# Patient Record
Sex: Female | Born: 1985 | Race: Black or African American | Hispanic: No | Marital: Single | Smoking: Former smoker
Health system: Southern US, Community
[De-identification: ages and names within clinical notes are randomized; demographics above are authoritative.]

## PROBLEM LIST (undated history)

## (undated) DIAGNOSIS — D649 Anemia, unspecified: Secondary | ICD-10-CM

## (undated) DIAGNOSIS — F329 Major depressive disorder, single episode, unspecified: Secondary | ICD-10-CM

## (undated) DIAGNOSIS — F99 Mental disorder, not otherwise specified: Secondary | ICD-10-CM

## (undated) DIAGNOSIS — F32A Depression, unspecified: Secondary | ICD-10-CM

## (undated) HISTORY — DX: Mental disorder, not otherwise specified: F99

## (undated) HISTORY — DX: Anemia, unspecified: D64.9

## (undated) HISTORY — PX: NO PAST SURGERIES: SHX2092

---
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
## 2008-08-31 ENCOUNTER — Encounter: Payer: Self-pay | Admitting: Maternal & Fetal Medicine

## 2008-12-12 ENCOUNTER — Observation Stay: Payer: Self-pay | Admitting: Obstetrics and Gynecology

## 2008-12-13 ENCOUNTER — Inpatient Hospital Stay: Payer: Self-pay | Admitting: Obstetrics and Gynecology

## 2010-12-20 ENCOUNTER — Ambulatory Visit: Payer: Self-pay | Admitting: Internal Medicine

## 2011-01-24 ENCOUNTER — Ambulatory Visit: Payer: Self-pay | Admitting: Family Medicine

## 2012-03-31 ENCOUNTER — Emergency Department: Payer: Self-pay | Admitting: Emergency Medicine

## 2012-04-23 ENCOUNTER — Ambulatory Visit: Payer: Self-pay | Admitting: Family Medicine

## 2012-09-22 ENCOUNTER — Emergency Department: Payer: Self-pay | Admitting: Emergency Medicine

## 2012-09-22 LAB — BASIC METABOLIC PANEL
Anion Gap: 8 (ref 7–16)
BUN: 11 mg/dL (ref 7–18)
Chloride: 108 mmol/L — ABNORMAL HIGH (ref 98–107)
Co2: 24 mmol/L (ref 21–32)
Glucose: 91 mg/dL (ref 65–99)
Osmolality: 278 (ref 275–301)

## 2012-09-22 LAB — CBC
HGB: 13.4 g/dL (ref 12.0–16.0)
MCHC: 32.9 g/dL (ref 32.0–36.0)
MCV: 83 fL (ref 80–100)
RBC: 4.89 10*6/uL (ref 3.80–5.20)
RDW: 13 % (ref 11.5–14.5)
WBC: 5.3 10*3/uL (ref 3.6–11.0)

## 2012-10-29 ENCOUNTER — Emergency Department: Payer: Self-pay | Admitting: Emergency Medicine

## 2012-10-29 LAB — URINALYSIS, COMPLETE
Bacteria: NONE SEEN
Bilirubin,UR: NEGATIVE
Blood: NEGATIVE
Glucose,UR: NEGATIVE mg/dL (ref 0–75)
Nitrite: NEGATIVE
Protein: NEGATIVE
RBC,UR: 1 /HPF (ref 0–5)
Specific Gravity: 1.026 (ref 1.003–1.030)
Squamous Epithelial: 10
WBC UR: 1 /HPF (ref 0–5)

## 2012-10-29 LAB — COMPREHENSIVE METABOLIC PANEL
Albumin: 3.8 g/dL (ref 3.4–5.0)
Alkaline Phosphatase: 78 U/L (ref 50–136)
Bilirubin,Total: 0.3 mg/dL (ref 0.2–1.0)
Calcium, Total: 9.2 mg/dL (ref 8.5–10.1)
EGFR (African American): >60
EGFR (Non-African Amer.): >60
Glucose: 88 mg/dL (ref 65–99)
Osmolality: 285 (ref 275–301)
SGOT(AST): 16 U/L (ref 15–37)
Sodium: 144 mmol/L (ref 136–145)

## 2012-10-29 LAB — CBC
HGB: 12.6 g/dL (ref 12.0–16.0)
MCH: 26.9 pg (ref 26.0–34.0)
MCHC: 32.2 g/dL (ref 32.0–36.0)
MCV: 84 fL (ref 80–100)
Platelet: 339 10*3/uL (ref 150–440)
RDW: 13.4 % (ref 11.5–14.5)

## 2012-10-29 LAB — LIPASE, BLOOD: Lipase: 105 U/L (ref 73–393)

## 2013-05-23 IMAGING — CT CT ABD-PELV W/ CM
1 of 2 series · 15 of 32 positions shown, 19 images · IV contrast (isovue)
Comparison: None

REASON FOR EXAM: (1) VOMITING ABD PAIN W/ SOLIDS; (2) VOMITING ABSD APIN
W/ SOLIDS
COMMENTS:

PROCEDURE:     CT  - CT ABDOMEN / PELVIS  W  - October 29, 2012  [DATE]
RESULT:     History: Vomiting, abdominal pain
TECHNIQUE: Multiple axial images of the abdomen and pelvis were performed
from the lung bases to the pubic symphysis, with p.o. contrast and with 100
ml of Isovue 370 intravenous contrast.

[Series 2: 3mm soft tissue · axial · 0.60mm/px · z∈[-1010,-598]mm · 15 of 149 slices shown, 19 images]
[im 6/149  soft-tissue]
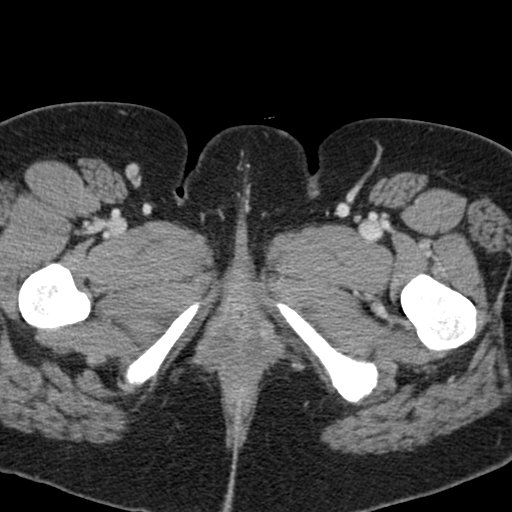
[im 6/149  bone]
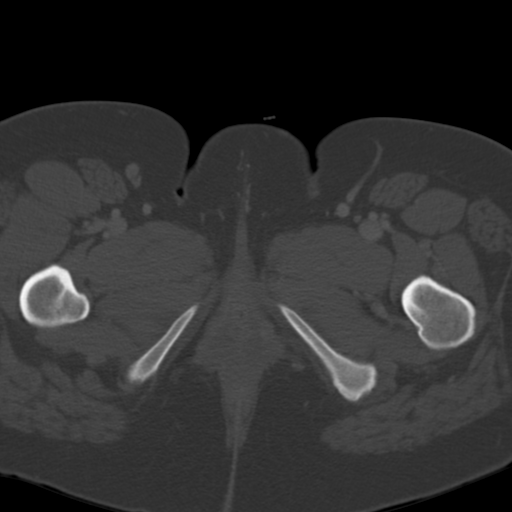
[im 18/149  soft-tissue]
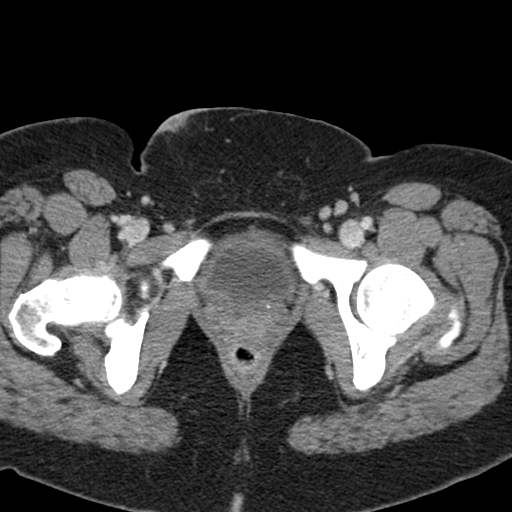
[im 30/149  soft-tissue]
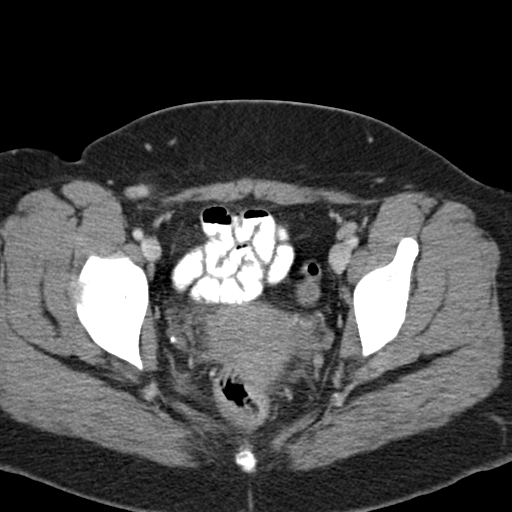
[im 42/149  soft-tissue]
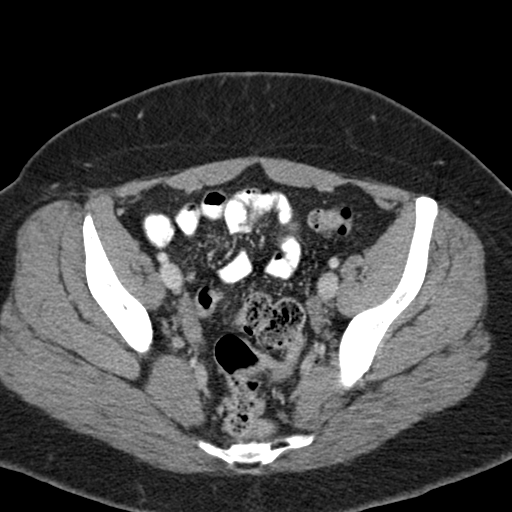
[im 54/149  soft-tissue]
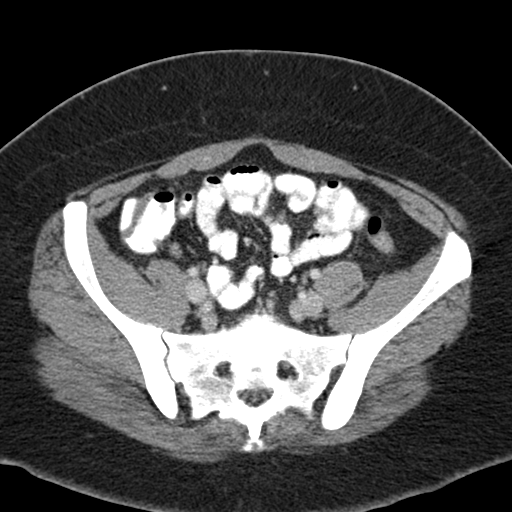
[im 66/149  soft-tissue]
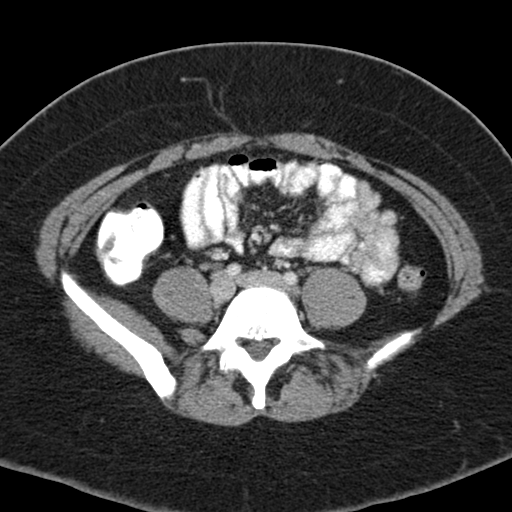
[im 77/149  soft-tissue]
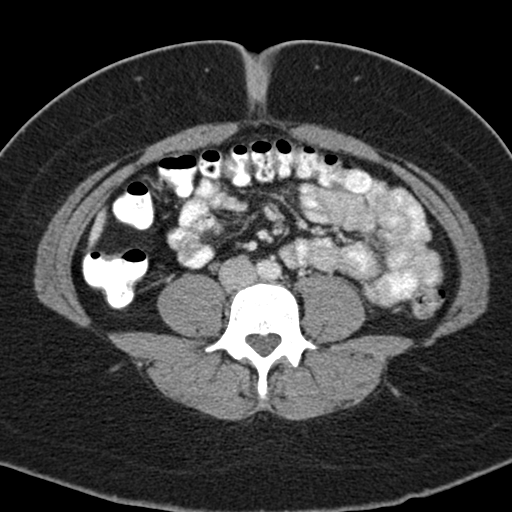
[im 83/149  soft-tissue]
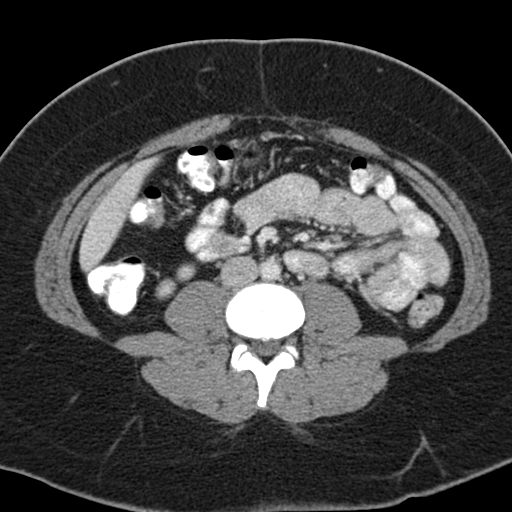
[im 95/149  soft-tissue]
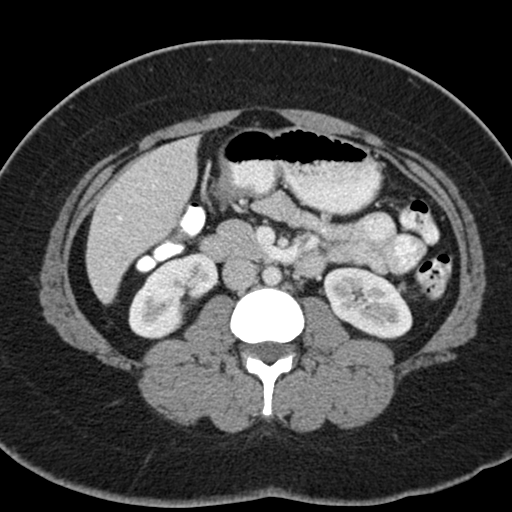
[im 95/149  bone]
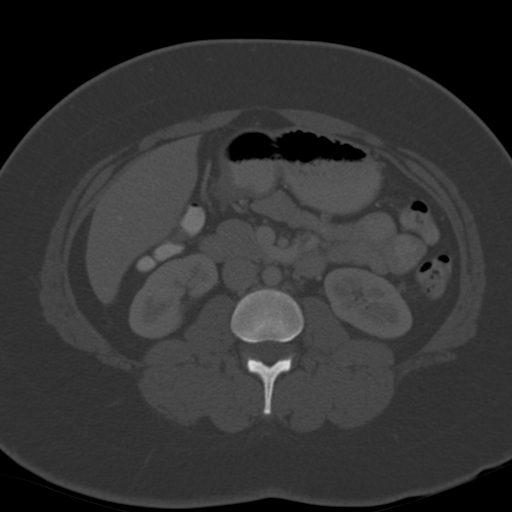
[im 107/149  soft-tissue]
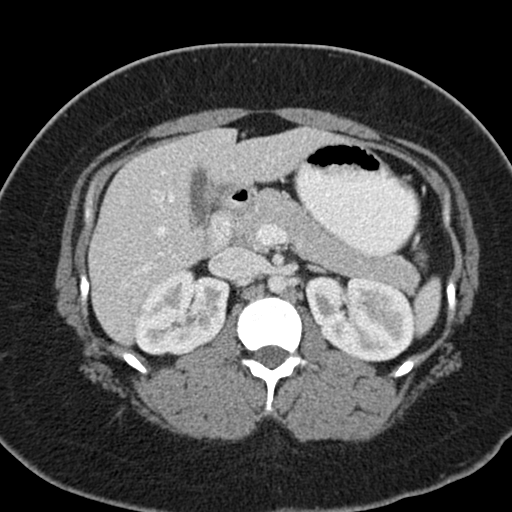
[im 119/149  soft-tissue]
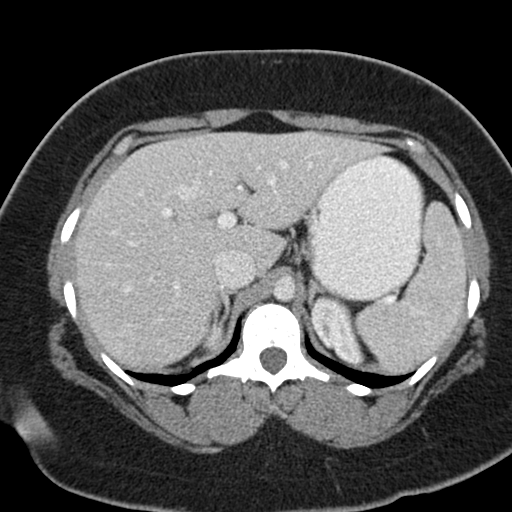
[im 125/149  lung]
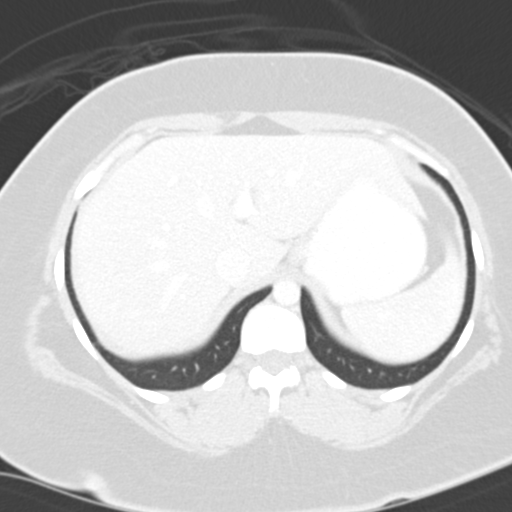
[im 131/149  soft-tissue]
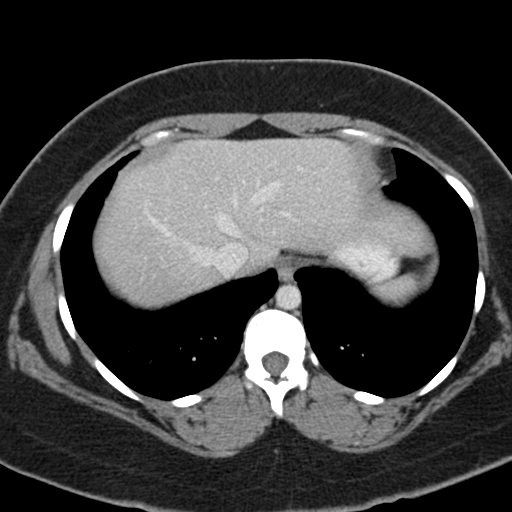
[im 131/149  lung]
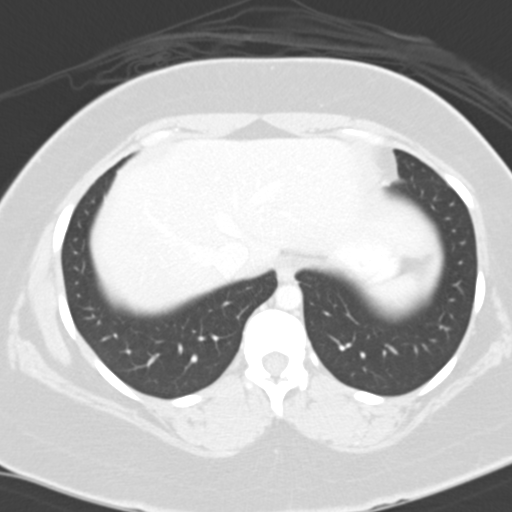
[im 137/149  lung]
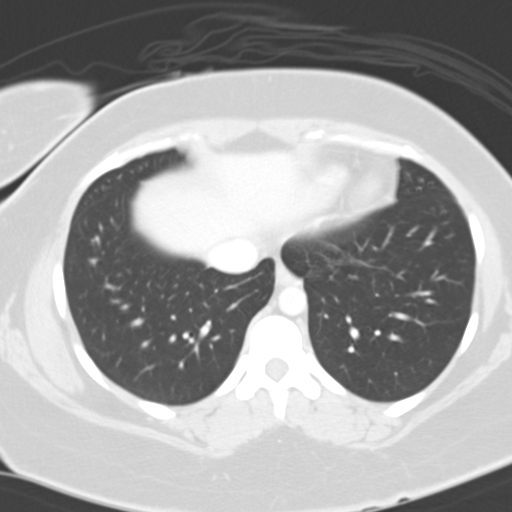
[im 143/149  soft-tissue]
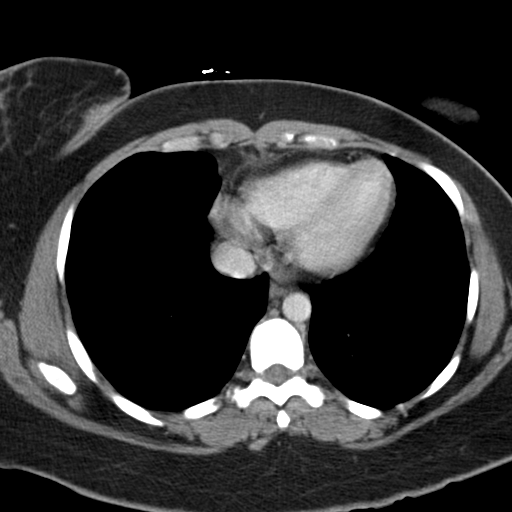
[im 143/149  lung]
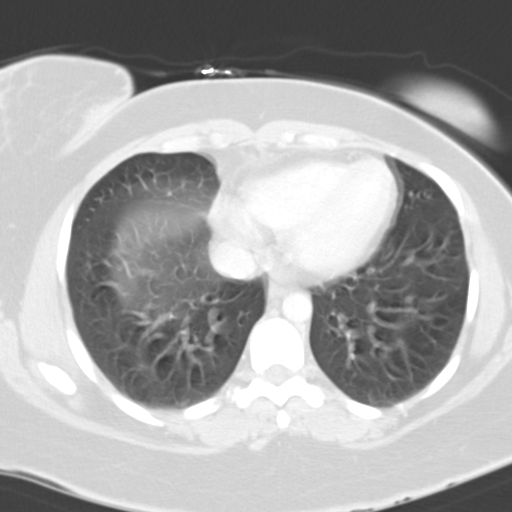

[15 of 32 positions shown; findings below may reference images not displayed]

FINDINGS: The lung bases are clear. There is no pneumothorax. The heart size is
normal.

The liver demonstrates no focal abnormality. There is no intrahepatic or
extrahepatic biliary ductal dilatation. The gallbladder is unremarkable. The
spleen demonstrates no focal abnormality. The kidneys, adrenal glands, and
pancreas are normal. The bladder is unremarkable.

The stomach, duodenum, small intestine, and large intestine demonstrate no
contrast extravasation or dilatation. There is a normal caliber appendix in
the right lower quadrant without periappendiceal inflammatory changes. There
is no pneumoperitoneum, pneumatosis, or portal venous gas. There is no
abdominal or pelvic free fluid. There is no lymphadenopathy.

The abdominal aorta is normal in caliber.

The osseous structures are unremarkable.
IMPRESSION: 1. No acute abdominal or pelvic pathology.

[REDACTED]

## 2013-08-22 ENCOUNTER — Emergency Department: Payer: Self-pay | Admitting: Emergency Medicine

## 2014-02-22 LAB — COMPREHENSIVE METABOLIC PANEL
ALK PHOS: 65 U/L
ANION GAP: 7 (ref 7–16)
AST: 18 U/L (ref 15–37)
Albumin: 3.8 g/dL (ref 3.4–5.0)
BILIRUBIN TOTAL: 0.5 mg/dL (ref 0.2–1.0)
BUN: 8 mg/dL (ref 7–18)
CALCIUM: 9.3 mg/dL (ref 8.5–10.1)
CHLORIDE: 108 mmol/L — AB (ref 98–107)
CREATININE: 0.68 mg/dL (ref 0.60–1.30)
Co2: 25 mmol/L (ref 21–32)
EGFR (African American): >60
EGFR (Non-African Amer.): >60
Glucose: 90 mg/dL (ref 65–99)
OSMOLALITY: 277 (ref 275–301)
POTASSIUM: 3.7 mmol/L (ref 3.5–5.1)
SGPT (ALT): 17 U/L (ref 12–78)
Sodium: 140 mmol/L (ref 136–145)
Total Protein: 8.2 g/dL (ref 6.4–8.2)

## 2014-02-22 LAB — URINALYSIS, COMPLETE
BILIRUBIN, UR: NEGATIVE
Blood: NEGATIVE
GLUCOSE, UR: NEGATIVE mg/dL (ref 0–75)
KETONE: NEGATIVE
Leukocyte Esterase: NEGATIVE
Nitrite: NEGATIVE
PH: 6 (ref 4.5–8.0)
Protein: NEGATIVE
RBC,UR: 3 /HPF (ref 0–5)
Specific Gravity: 1.024 (ref 1.003–1.030)
Squamous Epithelial: 31
WBC UR: NONE SEEN /HPF (ref 0–5)

## 2014-02-22 LAB — DRUG SCREEN, URINE
Amphetamines, Ur Screen: NEGATIVE (ref ?–1000)
Barbiturates, Ur Screen: NEGATIVE (ref ?–200)
Benzodiazepine, Ur Scrn: NEGATIVE (ref ?–200)
Cannabinoid 50 Ng, Ur ~~LOC~~: POSITIVE (ref ?–50)
Cocaine Metabolite,Ur ~~LOC~~: NEGATIVE (ref ?–300)
MDMA (Ecstasy)Ur Screen: NEGATIVE (ref ?–500)
Methadone, Ur Screen: NEGATIVE (ref ?–300)
Opiate, Ur Screen: NEGATIVE (ref ?–300)
PHENCYCLIDINE (PCP) UR S: NEGATIVE (ref ?–25)
TRICYCLIC, UR SCREEN: NEGATIVE (ref ?–1000)

## 2014-02-22 LAB — CBC
HCT: 37.9 % (ref 35.0–47.0)
HGB: 12.3 g/dL (ref 12.0–16.0)
MCH: 27.6 pg (ref 26.0–34.0)
MCHC: 32.4 g/dL (ref 32.0–36.0)
MCV: 85 fL (ref 80–100)
Platelet: 295 10*3/uL (ref 150–440)
RBC: 4.46 10*6/uL (ref 3.80–5.20)
RDW: 13.7 % (ref 11.5–14.5)
WBC: 8.7 10*3/uL (ref 3.6–11.0)

## 2014-02-22 LAB — ETHANOL: Ethanol: <3 mg/dL

## 2014-02-22 LAB — ACETAMINOPHEN LEVEL

## 2014-02-22 LAB — SALICYLATE LEVEL

## 2014-02-23 ENCOUNTER — Inpatient Hospital Stay: Payer: Self-pay | Admitting: Psychiatry

## 2014-09-11 NOTE — L&D Delivery Note (Signed)
Delivery Note  First Stage: Labor onset: 0700 Augmentation : None Analgesia /Anesthesia intrapartum: None SROM clear fluid at home @ 0600  Second Stage: Complete dilation at 1045 Onset of pushing at 1045 FHR second stage:   Delivery of a viable female at 62 by Carlean Jews, CNM in LOA position No nuchal cord Cord double clamped after cessation of pulsation, cut by CNM Cord blood sample collected   Third Stage: Placenta delivered via Tomasa Blase intact with 3 VC @ 1059 Placenta disposition: Hospital disposal Uterine tone Firm / bleeding - scant  No laceration identified / small abrasion on upper left labia minora - hemostatic and no sutures necessary Anesthesia for repair: non Est. Blood Loss (mL): 150  Complications: none  Mom to postpartum.  Baby to Couplet care / Skin to Skin.  Newborn: Birth Weight: Pending  Apgar Scores: 9, 9 Feeding planned: breast/bottle  [name], CNM

## 2014-11-05 ENCOUNTER — Ambulatory Visit: Payer: Self-pay | Admitting: Family Medicine
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
## 2015-01-04 ENCOUNTER — Other Ambulatory Visit: Payer: Self-pay | Admitting: Family Medicine

## 2015-01-04 DIAGNOSIS — Z0489 Encounter for examination and observation for other specified reasons: Secondary | ICD-10-CM

## 2015-01-04 DIAGNOSIS — IMO0002 Reserved for concepts with insufficient information to code with codable children: Secondary | ICD-10-CM

## 2015-01-18 ENCOUNTER — Ambulatory Visit
Admission: RE | Admit: 2015-01-18 | Discharge: 2015-01-18 | Disposition: A | Discharge status: Home / Self Care | Payer: Medicaid Other | Source: Ambulatory Visit | Attending: Family Medicine | Admitting: Family Medicine

## 2015-01-18 DIAGNOSIS — Z36 Encounter for antenatal screening of mother: Secondary | ICD-10-CM | POA: Diagnosis not present

## 2015-01-18 DIAGNOSIS — Z0489 Encounter for examination and observation for other specified reasons: Secondary | ICD-10-CM

## 2015-01-18 DIAGNOSIS — IMO0002 Reserved for concepts with insufficient information to code with codable children: Secondary | ICD-10-CM

## 2015-01-18 DIAGNOSIS — Z3A20 20 weeks gestation of pregnancy: Secondary | ICD-10-CM | POA: Insufficient documentation

## 2015-05-06 LAB — OB RESULTS CONSOLE RUBELLA ANTIBODY, IGM: RUBELLA: NON-IMMUNE/NOT IMMUNE

## 2015-05-06 LAB — OB RESULTS CONSOLE VARICELLA ZOSTER ANTIBODY, IGG: VARICELLA IGG: IMMUNE

## 2015-05-06 LAB — OB RESULTS CONSOLE GC/CHLAMYDIA
CHLAMYDIA, DNA PROBE: NEGATIVE
GC PROBE AMP, GENITAL: NEGATIVE

## 2015-05-06 LAB — OB RESULTS CONSOLE GBS: GBS: NEGATIVE

## 2015-06-04 ENCOUNTER — Encounter: Payer: Self-pay | Admitting: *Deleted

## 2015-06-04 ENCOUNTER — Inpatient Hospital Stay
Admission: EM | Admit: 2015-06-04 | Discharge: 2015-06-06 | DRG: 775 | Disposition: A | Discharge status: Home / Self Care | Payer: Medicaid Other | Attending: Obstetrics and Gynecology | Admitting: Obstetrics and Gynecology

## 2015-06-04 DIAGNOSIS — D62 Acute posthemorrhagic anemia: Secondary | ICD-10-CM | POA: Diagnosis not present

## 2015-06-04 DIAGNOSIS — F17210 Nicotine dependence, cigarettes, uncomplicated: Secondary | ICD-10-CM | POA: Diagnosis present

## 2015-06-04 DIAGNOSIS — O99344 Other mental disorders complicating childbirth: Secondary | ICD-10-CM | POA: Diagnosis present

## 2015-06-04 DIAGNOSIS — F329 Major depressive disorder, single episode, unspecified: Secondary | ICD-10-CM | POA: Diagnosis present

## 2015-06-04 DIAGNOSIS — O9081 Anemia of the puerperium: Secondary | ICD-10-CM | POA: Diagnosis not present

## 2015-06-04 DIAGNOSIS — Z3A39 39 weeks gestation of pregnancy: Secondary | ICD-10-CM | POA: Diagnosis present

## 2015-06-04 DIAGNOSIS — Z79899 Other long term (current) drug therapy: Secondary | ICD-10-CM | POA: Diagnosis not present

## 2015-06-04 DIAGNOSIS — O99334 Smoking (tobacco) complicating childbirth: Secondary | ICD-10-CM | POA: Diagnosis present

## 2015-06-04 DIAGNOSIS — Z3483 Encounter for supervision of other normal pregnancy, third trimester: Secondary | ICD-10-CM | POA: Diagnosis present

## 2015-06-04 LAB — CBC
HCT: 34.6 % — ABNORMAL LOW (ref 35.0–47.0)
Hemoglobin: 11.4 g/dL — ABNORMAL LOW (ref 12.0–16.0)
MCH: 27.9 pg (ref 26.0–34.0)
MCHC: 33.1 g/dL (ref 32.0–36.0)
MCV: 84.4 fL (ref 80.0–100.0)
PLATELETS: 254 10*3/uL (ref 150–440)
RBC: 4.1 MIL/uL (ref 3.80–5.20)
RDW: 14.1 % (ref 11.5–14.5)
WBC: 10.9 10*3/uL (ref 3.6–11.0)

## 2015-06-04 LAB — TYPE AND SCREEN
ABO/RH(D): O POS
ANTIBODY SCREEN: NEGATIVE

## 2015-06-04 LAB — ABO/RH: ABO/RH(D): O POS

## 2015-06-04 MED ORDER — CITRIC ACID-SODIUM CITRATE 334-500 MG/5ML PO SOLN: 30.0000 mL | ORAL | Status: DC | PRN

## 2015-06-04 MED ORDER — SIMETHICONE 80 MG PO CHEW: 80.0000 mg | CHEWABLE_TABLET | ORAL | Status: DC | PRN

## 2015-06-04 MED ORDER — OXYTOCIN 40 UNITS IN LACTATED RINGERS INFUSION - SIMPLE MED
INTRAVENOUS | Status: AC
  Administered 2015-06-04: 999 mL/h via INTRAVENOUS
  Filled 2015-06-04: qty 1000

## 2015-06-04 MED ORDER — IBUPROFEN 600 MG PO TABS
600.0000 mg | ORAL_TABLET | Freq: Four times a day (QID) | ORAL | Status: DC
  Administered 2015-06-04 – 2015-06-06 (×8): 600 mg via ORAL
  Filled 2015-06-04 (×8): qty 1

## 2015-06-04 MED ORDER — ACETAMINOPHEN 325 MG PO TABS: 650.0000 mg | ORAL_TABLET | ORAL | Status: DC | PRN

## 2015-06-04 MED ORDER — AMMONIA AROMATIC IN INHA
RESPIRATORY_TRACT | Status: AC
  Filled 2015-06-04: qty 10

## 2015-06-04 MED ORDER — OXYTOCIN BOLUS FROM INFUSION: 500.0000 mL | INTRAVENOUS | Status: DC

## 2015-06-04 MED ORDER — OXYCODONE-ACETAMINOPHEN 5-325 MG PO TABS: 2.0000 | ORAL_TABLET | ORAL | Status: DC | PRN

## 2015-06-04 MED ORDER — MEASLES, MUMPS & RUBELLA VAC ~~LOC~~ INJ
0.5000 mL | INJECTION | Freq: Once | SUBCUTANEOUS | Status: AC
  Administered 2015-06-06: 0.5 mL via SUBCUTANEOUS
  Filled 2015-06-04: qty 0.5

## 2015-06-04 MED ORDER — LANOLIN HYDROUS EX OINT: TOPICAL_OINTMENT | CUTANEOUS | Status: DC | PRN

## 2015-06-04 MED ORDER — LIDOCAINE HCL (PF) 1 % IJ SOLN
INTRAMUSCULAR | Status: AC
  Filled 2015-06-04: qty 30

## 2015-06-04 MED ORDER — ZOLPIDEM TARTRATE 5 MG PO TABS: 5.0000 mg | ORAL_TABLET | Freq: Every evening | ORAL | Status: DC | PRN

## 2015-06-04 MED ORDER — ONDANSETRON HCL 4 MG/2ML IJ SOLN: 4.0000 mg | Freq: Four times a day (QID) | INTRAMUSCULAR | Status: DC | PRN

## 2015-06-04 MED ORDER — OXYTOCIN 40 UNITS IN LACTATED RINGERS INFUSION - SIMPLE MED
62.5000 mL/h | INTRAVENOUS | Status: DC
  Administered 2015-06-04: 999 mL/h via INTRAVENOUS

## 2015-06-04 MED ORDER — LACTATED RINGERS IV SOLN: INTRAVENOUS | Status: DC

## 2015-06-04 MED ORDER — ONDANSETRON HCL 4 MG PO TABS: 4.0000 mg | ORAL_TABLET | ORAL | Status: DC | PRN

## 2015-06-04 MED ORDER — DIBUCAINE 1 % RE OINT: 1.0000 "application " | TOPICAL_OINTMENT | RECTAL | Status: DC | PRN

## 2015-06-04 MED ORDER — OXYCODONE-ACETAMINOPHEN 5-325 MG PO TABS: 1.0000 | ORAL_TABLET | ORAL | Status: DC | PRN

## 2015-06-04 MED ORDER — BENZOCAINE-MENTHOL 20-0.5 % EX AERO: 1.0000 "application " | INHALATION_SPRAY | CUTANEOUS | Status: DC | PRN

## 2015-06-04 MED ORDER — DIPHENHYDRAMINE HCL 25 MG PO CAPS: 25.0000 mg | ORAL_CAPSULE | Freq: Four times a day (QID) | ORAL | Status: DC | PRN

## 2015-06-04 MED ORDER — LIDOCAINE HCL (PF) 1 % IJ SOLN
30.0000 mL | INTRAMUSCULAR | Status: DC | PRN
  Filled 2015-06-04: qty 30

## 2015-06-04 MED ORDER — ONDANSETRON HCL 4 MG/2ML IJ SOLN: 4.0000 mg | INTRAMUSCULAR | Status: DC | PRN

## 2015-06-04 MED ORDER — OXYTOCIN 10 UNIT/ML IJ SOLN
INTRAMUSCULAR | Status: AC
  Filled 2015-06-04: qty 2

## 2015-06-04 MED ORDER — WITCH HAZEL-GLYCERIN EX PADS: 1.0000 "application " | MEDICATED_PAD | CUTANEOUS | Status: DC | PRN

## 2015-06-04 MED ORDER — LACTATED RINGERS IV SOLN: 500.0000 mL | INTRAVENOUS | Status: DC | PRN

## 2015-06-04 MED ORDER — OXYCODONE-ACETAMINOPHEN 5-325 MG PO TABS
2.0000 | ORAL_TABLET | ORAL | Status: DC | PRN
Start: 2015-06-04 — End: 2015-06-04

## 2015-06-04 MED ORDER — PRENATAL MULTIVITAMIN CH
1.0000 | ORAL_TABLET | Freq: Every day | ORAL | Status: DC
  Administered 2015-06-04 – 2015-06-06 (×3): 1 via ORAL
  Filled 2015-06-04 (×3): qty 1

## 2015-06-04 MED ORDER — MISOPROSTOL 200 MCG PO TABS
ORAL_TABLET | ORAL | Status: AC
  Filled 2015-06-04: qty 4

## 2015-06-04 MED ORDER — SENNOSIDES-DOCUSATE SODIUM 8.6-50 MG PO TABS: 2.0000 | ORAL_TABLET | ORAL | Status: DC

## 2015-06-04 NOTE — Lactation Note (Signed)
This note was copied from the chart of Kristen Good. Lactation Consultation Note  Patient Name: Kristen Good [id number] Date: 06/04/2015 Reason for consult: Follow-up assessment;Other (Comment) (bleeding from right nipple when pt squeezes it)   Maternal Data  Pt noted bleeding from right nipple when she squeezed, I noted the blood but also colostrum, encouraged to not express so hard, does not cause her pain, pt wants to formula feed but is breastfeeding because her dr. Javier Docker her to try, she states she will try one more time, informed that the blood on nipple will not hurt baby.  Pt shown how to shaped breast to get a deeper latch, baby eager to nurse.  Feeding Feeding Type: Breast Milk Length of feed: 25 min (right breast)  LATCH Score/Interventions Latch: Grasps breast easily, tongue down, lips flanged, rhythmical sucking.  Audible Swallowing: A few with stimulation Intervention(s): Skin to skin  Type of Nipple: Everted at rest and after stimulation  Comfort (Breast/Nipple): Engorged, cracked, bleeding, large blisters, severe discomfort Problem noted: Cracked, bleeding, blisters, bruises (bleeding on right but no pain) Intervention(s): Expressed breast milk to nipple     Hold (Positioning): Assistance needed to correctly position infant at breast and maintain latch. Intervention(s): Support Pillows;Position options  LATCH Score: 6  Lactation Tools Discussed/Used [facility] Program: Yes   Consult Status      Kristen Good 06/04/2015, 5:18 PM

## 2015-06-04 NOTE — H&P (Signed)
  OB ADMISSION/ HISTORY & PHYSICAL:  Admission Date: 06/04/2015  9:35 AM  Admit Diagnosis: Active labor at Term  Kristen Good is a 29 y.o. female G2P1 presenting for active labor at 39+6 weeks.    Prenatal History: G2P1001   EDC : 06/05/2015, by [place] on 11/05/14 Prenatal care at [facility] Provider: Dr. Maryruth Hancock Prenatal course complicated by Tobacco abuse, history of a PPH with last delivery in 2010 at 40+ weeks requiring 2 units PRBC transfusion, hx of depression on zoloft and prozac  Prenatal Labs: ABO, Rh:  O positive (11/10/14) Antibody:  Negative (11/10/14) Rubella:   Non-Immune (11/10/14) RPR:   Non-reactive (11/10/14) HBsAg:   Negative (11/10/14) HIV:   Non-Reactive (11/10/14) GTT: 131 (03/04/15) GBS:   Negative (05/08/15)  Medical / Surgical History :  Past medical history: Prediabetes, bipolar - not on medications / Rubella Non-immune  Past surgical history: History reviewed. No pertinent past surgical history.  Family History: History reviewed. No pertinent family history.   Social History:  reports that she has been smoking Cigarettes.  She has been smoking about 0.25 packs per day. She does not have any smokeless tobacco history on file. She reports that she does not drink alcohol or use illicit drugs.   Allergies: Review of patient's allergies indicates no known allergies.    Current Medications at time of admission:  Prior to Admission medications   Medication Sig Start Date End Date Taking? Authorizing Provider  ferrous fumarate (HEMOCYTE - 106 MG FE) 325 (106 FE) MG TABS tablet Take 1 tablet by mouth.   Yes Historical Provider, MD  Prenatal Vit-Fe Fumarate-FA (PRENATAL MULTIVITAMIN) TABS tablet Take 1 tablet by mouth daily at 12 noon.   Yes Historical Provider, MD     Review of Systems: Active FM onset of ctx @ 0700 currently every 1-2 minutes LOF  / SROM @ 0600 at home - clear fluid bloody show none   Physical Exam:  VS: Blood pressure 130/60,  pulse 82, unknown if currently breastfeeding.  General: alert and oriented, appears anxious about delivery with no medications Heart: RRR Lungs: Clear lung fields Abdomen: Gravid, soft and non-tender, non-distended / obese / uterus: non-tender Extremities: non-pitting LE edema  Genitalia / VE: Dilation: Lip/rim Effacement (%): 100 Station: +1 Exam by:: JAC  FHR: baseline rate 140bmp / variability Moderate / accelerations 1 10x10 / no decelerations TOCO: ctx every 1-2 minutes   Assessment: 39+[redacted] weeks gestation Active stage of labor FHR category 2   Plan:  Anticipate NSVD  Dr. Dalbert Garnet notified of admission / plan of care  [name], CNM

## 2015-06-05 LAB — CBC
HEMATOCRIT: 29.9 % — AB (ref 35.0–47.0)
Hemoglobin: 10 g/dL — ABNORMAL LOW (ref 12.0–16.0)
MCH: 28.2 pg (ref 26.0–34.0)
MCHC: 33.5 g/dL (ref 32.0–36.0)
MCV: 84.1 fL (ref 80.0–100.0)
PLATELETS: 193 10*3/uL (ref 150–440)
RBC: 3.56 MIL/uL — ABNORMAL LOW (ref 3.80–5.20)
RDW: 14 % (ref 11.5–14.5)
WBC: 10.3 10*3/uL (ref 3.6–11.0)

## 2015-06-05 LAB — RPR: RPR Ser Ql: NONREACTIVE

## 2015-06-05 MED ORDER — POLYSACCHARIDE IRON COMPLEX 150 MG PO CAPS
150.0000 mg | ORAL_CAPSULE | Freq: Every day | ORAL | Status: DC
  Administered 2015-06-05 – 2015-06-06 (×2): 150 mg via ORAL
  Filled 2015-06-05 (×2): qty 1

## 2015-06-05 NOTE — Progress Notes (Addendum)
PPD # 1, SVD, baby girl  S:  Reports feeling good with minimal pain  Did not get much rest overnight             Tolerating po/ No nausea or vomiting             Bleeding is light             Pain controlled with Motrin             Up ad lib / ambulatory / voiding QS - no dysuria / no discomfort with abrasion    Newborn breast and formula feeding   O:               VS: BP 93/50 mmHg  Pulse 78  Temp(Src) 98 F (36.7 C) (Oral)  Resp 18  Ht  (1.6 m)  Wt 108.863 kg (240 lb)  BMI 42.52 kg/m2  SpO2 100%  Breastfeeding? Unknown   LABS:              Recent Labs  06/04/15 1109 06/05/15 0449  WBC 10.9 10.3  HGB 11.4* 10.0*  PLT 254 193               Blood type: --/--/O POS (09/23 1110)  Rubella: Nonimmune (08/25 0000)                     I&O: Intake/Output      09/23 0701 - 09/24 0700 09/24 0701 - 09/25 0700   I.V. (mL/kg) 1940.2 (17.8)    Total Intake(mL/kg) 1940.2 (17.8)    Urine (mL/kg/hr) 425    Total Output 425     Net +1515.2          Urine Occurrence 1 x                  Physical Exam:             Alert and oriented X3  Lungs: Clear and unlabored  Heart: regular rate and rhythm / no mumurs  Abdomen: soft, non-tender, non-distended              Fundus: firm, non-tender, U -1   Perineum: intact, no significant edema, no erythema, no ecchymosis  Lochia: light, bright red, no clots  Extremities: no edema, no calf pain or tenderness    A: PPD # 1, SVD, baby girl   Doing well - stable status  Mild ABL Anemia   P: Routine post partum orders  Begin Niferex daily   Encouraged to rest this morning, but may ambulate in halls this afternoon  Anticipate discharge tomorrow morning  [name], CNM

## 2015-06-06 MED ORDER — MEDROXYPROGESTERONE ACETATE 150 MG/ML IM SUSP
150.0000 mg | Freq: Once | INTRAMUSCULAR | Status: AC
  Administered 2015-06-06: 150 mg via INTRAMUSCULAR
  Filled 2015-06-06: qty 1

## 2015-06-06 MED ORDER — IBUPROFEN 600 MG PO TABS: 600.0000 mg | ORAL_TABLET | Freq: Four times a day (QID) | ORAL | Status: DC

## 2015-06-06 NOTE — Discharge Instructions (Signed)
Postpartum Care After Vaginal Delivery °After you deliver your newborn (postpartum period), the usual stay in the hospital is 24-72 hours. If there were problems with your labor or delivery, or if you have other medical problems, you might be in the hospital longer.  °While you are in the hospital, you will receive help and instructions on how to care for yourself and your newborn during the postpartum period.  °While you are in the hospital: °· Be sure to tell your nurses if you have pain or discomfort, as well as where you feel the pain and what makes the pain worse. °· If you had an incision made near your vagina (episiotomy) or if you had some tearing during delivery, the nurses may put ice packs on your episiotomy or tear. The ice packs may help to reduce the pain and swelling. °· If you are breastfeeding, you may feel uncomfortable contractions of your uterus for a couple of weeks. This is normal. The contractions help your uterus get back to normal size. °· It is normal to have some bleeding after delivery. °· For the first 1-3 days after delivery, the flow is red and the amount may be similar to a period. °· It is common for the flow to start and stop. °· In the first few days, you may pass some small clots. Let your nurses know if you begin to pass large clots or your flow increases. °· Do not  flush blood clots down the toilet before having the nurse look at them. °· During the next 3-10 days after delivery, your flow should become more watery and pink or brown-tinged in color. °· Ten to fourteen days after delivery, your flow should be a small amount of yellowish-white discharge. °· The amount of your flow will decrease over the first few weeks after delivery. Your flow may stop in 6-8 weeks. Most women have had their flow stop by 12 weeks after delivery. °· You should change your sanitary pads frequently. °· Wash your hands thoroughly with soap and water for at least 20 seconds after changing pads, using  the toilet, or before holding or feeding your newborn. °· You should feel like you need to empty your bladder within the first 6-8 hours after delivery. °· In case you become weak, lightheaded, or faint, call your nurse before you get out of bed for the first time and before you take a shower for the first time. °· Within the first few days after delivery, your breasts may begin to feel tender and full. This is called engorgement. Breast tenderness usually goes away within 48-72 hours after engorgement occurs. You may also notice milk leaking from your breasts. If you are not breastfeeding, do not stimulate your breasts. Breast stimulation can make your breasts produce more milk. °· Spending as much time as possible with your newborn is very important. During this time, you and your newborn can feel close and get to know each other. Having your newborn stay in your room (rooming in) will help to strengthen the bond with your newborn.  It will give you time to get to know your newborn and become comfortable caring for your newborn. °· Your hormones change after delivery. Sometimes the hormone changes can temporarily cause you to feel sad or tearful. These feelings should not last more than a few days. If these feelings last longer than that, you should talk to your caregiver. °· If desired, talk to your caregiver about methods of family planning or contraception. °·   Talk to your caregiver about immunizations. Your caregiver may want you to have the following immunizations before leaving the hospital:  Tetanus, diphtheria, and pertussis (Tdap) or tetanus and diphtheria (Td) immunization. It is very important that you and your family (including grandparents) or others caring for your newborn are up-to-date with the Tdap or Td immunizations. The Tdap or Td immunization can help protect your newborn from getting ill.  Rubella immunization.  Varicella (chickenpox) immunization.  Influenza immunization. You should  receive this annual immunization if you did not receive the immunization during your pregnancy. Document Released: 06/25/2007 Document Revised: 05/22/2012 Document Reviewed: 04/24/2012 [facility] Patient Information 2015 [place], [place]. This information is not intended to replace advice given to you by your health care provider. Make sure you discuss any questions you have with your health care provider.  Breast Pumping Tips If you are breastfeeding, there may be times when you cannot feed your baby directly. Returning to work or going on a trip are common examples. Pumping allows you to store breast milk and feed it to your baby later.  You may not get much milk when you first start to pump. Your breasts should start to make more after a few days. If you pump at the times you usually feed your baby, you may be able to keep making enough milk to feed your baby without also using formula. The more often you pump, the more milk you will produce. WHEN SHOULD I PUMP?   You can begin to pump soon after delivery. However, some experts recommend waiting about 4 weeks before giving your infant a bottle to make sure breastfeeding is going well.  If you plan to return to work, begin pumping a few weeks before. This will help you develop techniques that work best for you. It also lets you build up a supply of breast milk.   When you are with your infant, feed on demand and pump after each feeding.   When you are away from your infant for several hours, pump for about 15 minutes every 2-3 hours. Pump both breasts at the same time if you can.   If your infant has a formula feeding, make sure to pump around the same time.   If you drink any alcohol, wait 2 hours before pumping.  HOW DO I PREPARE TO PUMP? Your let-down reflexis the natural reaction to stimulation that makes your breast milk flow. It is easier to stimulate this reflex when you are relaxed. Find relaxation techniques that work for you.  If you have difficulty with your let-down reflex, try these methods:   Smell one of your infant's blankets or an item of clothing.   Look at a picture or video of your infant.   Sit in a quiet, private space.   Massage the breast you plan to pump.   Place soothing warmth on the breast.   Play relaxing music.  WHAT ARE SOME GENERAL BREAST PUMPING TIPS?  Wash your hands before you pump. You do not need to wash your nipples or breasts.  There are three ways to pump.  You can use your hand to massage and compress your breast.  You can use a handheld manual pump.  You can use an electric pump.   Make sure the suction cup (flange) on the breast pump is the right size. Place the flange directly over the nipple. If it is the wrong size or placed the wrong way, it may be painful and cause nipple damage.  If pumping is uncomfortable, apply a small amount of purified or modified lanolin to your nipple and areola.  If you are using an electric pump, adjust the speed and suction power to be more comfortable.  If pumping is painful or if you are not getting very much milk, you may need a different type of pump. A lactation consultant can help you determine what type of pump to use.   Keep a full water bottle near you at all times. Drinking lots of fluid helps you make more milk.  You can store your milk to use later. Pumped breast milk can be stored in a sealable, sterile container or plastic bag. Label all stored breast milk with the date you pumped it.  Milk can stay out at room temperature for up to 8 hours.  You can store your milk in the refrigerator for up to 8 days.  You can store your milk in the freezer for 3 months. Thaw frozen milk using warm water. Do not put it in the microwave.  Do not smoke. Smoking can lower your milk supply and harm your infant. If you need help quitting, ask your health care provider to recommend a program.  WHEN SHOULD I CALL MY HEALTH  CARE PROVIDER OR A LACTATION CONSULTANT?  You are having trouble pumping.  You are concerned that you are not making enough milk.  You have nipple pain, soreness, or redness.  You want to use birth control. Birth control pills may lower your milk supply. Talk to your health care provider about your options. Document Released: 02/15/2010 Document Revised: 09/02/2013 Document Reviewed: 06/20/2013 [facility] Patient Information 2015 [place], [place]. This information is not intended to replace advice given to you by your health care provider. Make sure you discuss any questions you have with your health care provider. Acetaminophen tablets or caplets What is this medicine? ACETAMINOPHEN (a set a MEE noe fen) is a pain reliever. It is used to treat mild pain and fever. This medicine may be used for other purposes; ask your health care provider or pharmacist if you have questions. COMMON BRAND NAME(S): Aceta, Actamin, Anacin Aspirin Free, Genapap, Genebs, Mapap, Pain & Fever, Pain and Fever, PAIN RELIEF, PAIN RELIEF Extra Strength, Pain Reliever, Panadol, PHARBETOL, Q-Pap, Q-Pap Extra Strength, Tylenol, Tylenol CrushableTablet, Tylenol Extra Strength, XS No Aspirin, XS Pain Reliever What should I tell my health care provider before I take this medicine? They need to know if you have any of these conditions: -if you often drink alcohol -liver disease -an unusual or allergic reaction to acetaminophen, other medicines, foods, dyes, or preservatives -pregnant or trying to get pregnant -breast-feeding How should I use this medicine? Take this medicine by mouth with a glass of water. Follow the directions on the package or prescription label. Take your medicine at regular intervals. Do not take your medicine more often than directed. Talk to your pediatrician regarding the use of this medicine in children. While this drug may be prescribed for children as young as 72 years of age for selected conditions,  precautions do apply. Overdosage: If you think you have taken too much of this medicine contact a poison control center or emergency room at once. NOTE: This medicine is only for you. Do not share this medicine with others. What if I miss a dose? If you miss a dose, take it as soon as you can. If it is almost time for your next dose, take only that dose. Do not take double or  extra doses. What may interact with this medicine? -alcohol -imatinib -isoniazid -other medicines with acetaminophen This list may not describe all possible interactions. Give your health care provider a list of all the medicines, herbs, non-prescription drugs, or dietary supplements you use. Also tell them if you smoke, drink alcohol, or use illegal drugs. Some items may interact with your medicine. What should I watch for while using this medicine? Tell your doctor or health care professional if the pain lasts more than 10 days (5 days for children), if it gets worse, or if there is a new or different kind of pain. Also, check with your doctor if a fever lasts for more than 3 days. Do not take other medicines that contain acetaminophen with this medicine. Always read labels carefully. If you have questions, ask your doctor or pharmacist. If you take too much acetaminophen get medical help right away. Too much acetaminophen can be very dangerous and cause liver damage. Even if you do not have symptoms, it is important to get help right away. What side effects may I notice from receiving this medicine? Side effects that you should report to your doctor or health care professional as soon as possible: -allergic reactions like skin rash, itching or hives, swelling of the face, lips, or tongue -breathing problems -fever or sore throat -redness, blistering, peeling or loosening of the skin, including inside the mouth -trouble passing urine or change in the amount of urine -unusual bleeding or bruising -unusually weak or  tired -yellowing of the eyes or skin Side effects that usually do not require medical attention (report to your doctor or health care professional if they continue or are bothersome): -headache -nausea, stomach upset This list may not describe all possible side effects. Call your doctor for medical advice about side effects. You may report side effects to FDA at 1-800-FDA-1088. Where should I keep my medicine? Keep out of reach of children. Store at room temperature between 20 and 25 degrees C (68 and 77 degrees F). Protect from moisture and heat. Throw away any unused medicine after the expiration date. NOTE: This sheet is a summary. It may not cover all possible information. If you have questions about this medicine, talk to your doctor, pharmacist, or health care provider.  2015, Elsevier/Gold Standard. (2013-04-21 12:54:16) Prenatal Vitamin and Mineral Combinations (oral solid dosage forms) What is this medicine? PRENATAL VITAMIN AND MINERAL combinations are used before, during, and after pregnancy to help provide provide good nutrition. This medicine may be used for other purposes; ask your health care provider or pharmacist if you have questions. COMMON BRAND NAME(S): Active OB, Advanced Care Plus, Advanced NatalCare, Advanced-RF NatalCare, Aminate Fe, Anemagen OB, B-Nexa, BP FoliNatal Plus B, BP MultiNatal Plus, BP MultiNatal Plus Chewable, BP Prenate, Brainstrong, Bright Beginnings Prenatal, Cal-Nate, Calcium PNV, CareNatal DHA, CareNate 600, Cavan One Omega, Cavan Prenatal with EC Calcium, Cavan-Alpha, Cavan-EC SOD DHA, Cavan-Heme OB, Cavan-Heme Omega, Cenogen Ultra, Centrum Specialist Prenatal, CertaVite with Antioxidants, Choice-OB + DHA, Citracal Prenatal, Citracal Prenatal + DHA, CitraNatal 90 DHA, CitraNatal Assure, CitraNatal B-Calm, CitraNatal DHA, CitraNatal Harmony, CitraNatal Rx, Classic Prenatal, ComBi Rx, Complete Natal DHA, Complete-RF, CompleteNate, Concept DHA, Concept OB,  CoreNate-DHA, Corvite FE with Quatrefolic, CRNatal DHA, Daily Vitamin, Docosavit, Duet, Duet Chewable, Duet DHA, Duet DHA 400, Duet DHA 430ec, Duet DHA Balanced, Duet DHA Complete, Duet DHA Complete Gluten Free, Duet DHA EC, Duet DHA Ferrazone, EC Omega-3, DuoVit DHA, Edge OB, Elite OB, Elite OB with DHA, Elite-OB 400, Extra-Virt Plus, Femecal  OB, Femecal OB Plus DHA, Ferrocite Plus, Folbecal, Folcal DHA, [address] One, [address] 3, [place] One with [place], [place] OB, Folivane-EC Calcium DHA NF, Foltabs 90 Plus DHA, Foltabs Prenatal, Foltabs Prenatal Plus DHA, Gesticare, Gesticare DHA, Gesticare DHA Delayed-Release, HemeNatal OB, HemeNatal OB + DHA, Hemocyte Plus, HIP Prenatal, ICAR Prenatal Rx, iNatal Advance, iNatal GT, iNatal Ultra, Infanate Balance, Infanate DHA, Infanate Plus, Kolnatal, Lactocal-F, Levomefolate PNV, MACNATAL CN DHA, Marnatal-F, Marnatal-F Plus, Materna, Maxinate, Mission Prenatal, Mission Prenatal F.A., Mission Prenatal H.P., Mom's Choice Rx, Multi-Nate 30, Multi-Nate 30 DHA, Multi-Nate DHA Extra, Multifol Plus, Multivitamin With Minerals, MyNatal OB Prenatal, NataCaps, NataChew, NataFolic-OB, Natafort, Natal-V RX, NatalCare CFe 60, NatalCare GlossTabs, NatalCare PIC, [employer], [employer], [employer], [address] Three, NATALVIRT 90 DHA, NATALVIRT CA, NataTab CFe, NataTab FA, Natatab Rx, Natelle, Natelle C, Natelle One, [employer] with DHA, Natelle Prefer, Natelle-ez, Navatab + DHA, Neevo, Neevo DHA, Nestabs, Nestabs ABC, Nestabs CBF, Nestabs DHA, Nestabs FA, Nestabs Rx, New Advanced Formula Prenatal Z, Nexa Plus, Nexa Select, Niferex-PN, Niferex-PN Forte, NovaNatal, NovaStart, Nu-Natal, NutraCare, Nutri-Tab OB, Nutri-Tab OB + DHA, Nutrinate, NutriSpire, O-Cal F.A., O-Cal Prenatal, OB Choice, OB Complete, OB Complete 400, OB Complete One, OB Complete Petite, OB Complete Premier, OB Complete with DHA, OB-Natal One, Obstetrix-100, Obtrex, Obtrex DHA, One-A-Day  Women's, One-A-Day Women's Prenatal, OptiNate, Paire OB Tablet Plus DHA, PNV OB + DHA, PNV Prenatal, PNV Prenatal Plus Multivitamin, PNV Tabs 29-1, PNV-DHA, PNV-DHA + Docusate, PNV-DHA Plus, PNV-First, PNV-Iron, PNV-OB with DHA, PNV-Omega, PNV-Select, PNV-Total with DHA, PNV-VP-U, PR [place] 400, PR [address] 400ec, PR [address] 430, PR [address] 430ec, PR [place] 440ec, PreCare, PreferaOB, PreferaOB + DHA, PreferaOB One, Premesis Rx, [address], [address] Plus, [address], [place], [place], Prenaissance 90 DHA, Prenaissance Balance, Prenaissance DHA, Prenaissance Harmony DHA, Prenaissance Next, Prenaissance Plus, Prenaissance Promise, PrenaPlus, Prenat with Quatrefolic, PreNata Multivitamin with Iron, Prenatabs CBF, Prenatabs FA, Prenatabs OBN, Prenatabs RX, Prenatal, Prenatal 1 Plus 1, Prenatal 19, Prenatal AD, Prenatal Formula 3, Prenatal H, Prenatal Low Iron, Prenatal MR 62 Fe, Prenatal MTR with Selenium, Prenatal Multivitamin + DHA 2, Prenatal Optima Advance, Prenatal Plus, Prenatal Plus Iron, Prenatal Plus Low Iron, Prenatal Rx with Beta Carotene, Prenatal S, Prenatal U, Prenatal Vitamin, PreNatal Vitamins Plus, Prenate Advance, Prenate AM with Quatrefolic, Prenate DHA, Prenate Elite, Prenate Enhance with [employer], Prenate Essential, Prenate GT, Prenate Mini, PreNate Plus, Prenate Restore with [employer], [address] Prenate Ultra, Prenavite, Prenavite Protein, PreNexa, PreNexa Premier, PrePLUS, PreQue, PreTAB, Previte Rx, PrimaCare, [employer], PrimaCare ONE, Provida OB, PruEt DHA, PruEt DHAec, PureFe OB Plus, PureFe Plus, PureVit DualFe Plus, RE DualVit OB, RE DualVit Plus, RE OB + DHA, RE OB 90 + DHA, RE Prenatal, RE PreVit + DHA, RE-Nata 29, RE-Nata 29 OB, Reaphrim, Renate, Renate DHA, Renate DHA Extra, REocyte Plus, Right Step, Rovin-Nv, Rovin-Nv DHA, Se-Care, Se-Care Conceive, Se-Care Gesture, Se-Natal 19, Se-Natal 19 Chewable, Se-Natal 90, Se-Natal ONE, Se-Plete DHA, Se-Tan DHA, Se-Tan Plus,  Select-OB, Select-OB + DHA, SetonET, SetonET-EC DHA, StrongStart, StrongStart Chewable Tablet, Lu [place]el One, Sempra Energy, Stuart Prenatal + DHA, Stuartnatal Plus 3, Tandem DHA, Tandem OB, Tandem Plus, Taron A Prenatal Pack with DHA, Taron EC Calcium DHA Pack, Taron Prenatal with DHA, Taron-C DHA, Taron-EC Cal, Taron-Prex Prenatal with DHA, Thera [place] Complete, Thera [place] Core Nutrition, Thera [place] Lactation Support, Thera [address] OvaVite, Thera NIKE, Thera-Tabs, TL-Care DHA, TL-Select, TL-Select DHA, Tri Rx, TriAdvance, Kohl's, TriCare Prenatal DHA ONE, Trifera OB, Trimesis Rx, Trinatal GT, Aurora Rx 1, Trinate, Triveen-Duo DHA, Triveen-PRx RNF,  Triveen-Ten, Trust [employer]ley[employer], UltimateCare Advantage, UltimateCare Combo, UltimateCare ONE, UltimateCare ONE NF, Ultra NatalCare, VemaVite-PRx 2, Vena-Bal DHA, Venatal-FA, Verotin-BY, Verotin-GR, Vinacal, Vinacal B, Vinatal Forte, Vinate 90, Vinate AZ, Vinate AZ Extra, Vinate C, Vinate Calcium, Vinate Care, Vinate DHA, Vinate GT, Vinate IC, Stebb[place] II, [place] III, Vinate M Low Iron, Vinate One, Vinate PN, Vinate Ultra, Virt Nate, Virt-Advance, Virt-C DHA, Virt-Care One, Virt-PN, Virt-PN DHA, Virt-PN Plus, Virt-Select, Virt-Vite GT, VirtPrex, Vitafol PN, Vitafol Ultra, Vitafol-Nano Prenatal, Vitafol-OB, Vitafol-OB + DHA, Vitafol-OB and DHA, Vitafol-One, VitaMed MD Plus Rx, VitaNatal OB Plus DHA, vitaPearl Prenatal, VitaPhil, VitaPhil + DHA, VitaPhil + DHA 90, VitaPhil AiDE, VitaSpire, Viva CT, VIVA DHA, Vol-Tab Rx, VP CH Ultra, VP-CH-PNV, VP-GGR-B6 Prenatal, VP-HEME OB, VP-HEME OB+ DHA, VP-PNV-DHA, Zatean-CH, Zatean-Pn, Zatean-Pn DHA, Zatean-Pn Plus, Zingiber What should I tell my health care provider before I take this medicine? They need to know if you have any of these conditions: -bleeding or clotting disorder -history of anemia of any type -other chronic health condition -an unusual or allergic reaction to vitamins, minerals, other medicines, foods,  dyes, or preservatives How should I use this medicine? Take this medicine by mouth with a glass of water. You can take it with or without food. If it upsets your stomach, take it with food. Chewable prenatal vitamin tablets may be chewed completely before swallowing. Follow the directions on the prescription label. The usual dose is taken once a day. Do not take your medicine more often than directed. Contact your pediatrician regarding the use of this medicine in children. Special care may be needed. This medicine is intended for females who are pregnant, breast-feeding, or may become pregnant. Overdosage: If you think you have taken too much of this medicine contact a poison control center or emergency room at once. NOTE: This medicine is only for you. Do not share this medicine with others. What if I miss a dose? If you miss a dose, take it as soon as you can. If it is almost time for your next dose, take only that dose. Do not take double or extra doses. What may interact with this medicine? -alendronate -antacids -cefdinir -cefditoren -etidronate -fluoroquinolone antibiotics (examples: ciprofloxacin, gatifloxacin, levofloxacin) -ibandronate -levodopa -risedronate -tetracycline antibiotics (examples: doxycycline, minocycline, tetracycline) -thyroid hormones -warfarin This list may not describe all possible interactions. Give your health care provider a list of all the medicines, herbs, non-prescription drugs, or dietary supplements you use. Also tell them if you smoke, drink alcohol, or use illegal drugs. Some items may interact with your medicine. What should I watch for while using this medicine? See your health care professional for regular checks on your progress. Remember that vitamin and mineral supplements do not replace the need for good nutrition from a balanced diet. Stools commonly change color when vitamins and minerals are taken. Notify your health care professional if this  change is alarming or accompanied by other symptoms, like abdominal pain. What side effects may I notice from receiving this medicine? Side effects that you should report to your doctor or health care professional as soon as possible: -allergic reaction such as skin rash or difficulty breathing -vomiting Side effects that usually do not require medical attention (report to your doctor or health care professional if they continue or are bothersome): -nausea -stomach upset This list may not describe all possible side effects. Call your doctor for medical advice about side effects. You may report side effects to FDA at 1-800-FDA-1088. Where should I keep my medicine? Keep out  of the reach of children. Most vitamins and minerals should be stored at controlled room temperature. Check your specific product directions. Protect from heat and moisture. Throw away any unused medicine after the expiration date. NOTE: This sheet is a summary. It may not cover all possible information. If you have questions about this medicine, talk to your doctor, pharmacist, or health care provider.  2015, Elsevier/Gold Standard. (2013-12-23 08:34:45)

## 2015-06-06 NOTE — Clinical Social Work Note (Signed)
CSW spoke to pt yesterday.  She was alert and Ox4.  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  It is her 6 week follow up apt.  She has been seeing this Dr for 6+ years and had this plan in place at the beginning of her pregnancy.  There are 4 adults in the home and pt and children (pt has another daughter 29 yrs old) all have their needs met.  No claims of abuse.  So signs of abuse.  Per pt no Hx of substance abuse, and no current thought of SI/HI, or self harm.  CSW signing off.

## 2015-06-06 NOTE — Progress Notes (Addendum)
PPD #2 SVD, baby girl   S:  Reports feeling good and ready to go home  Reports history of depression and has prescriptions for Zoloft and Prozac at home that she will take when she's having "bad days" - has a f/u with [facility] at her 6 week PP visit             Tolerating po/ No nausea or vomiting             Bleeding is light             Pain controlled with Motrin             Up ad lib / ambulatory / voiding QS  Newborn breast feeding with formula supplementation     O:               VS: BP 109/68 mmHg  Pulse 81  Temp(Src) 98.3 F (36.8 C) (Oral)  Resp 20  Ht 5\' 3"  (1.6 m)  Wt 108.863 kg (240 lb)  BMI 42.52 kg/m2  SpO2 100%  Breastfeeding? Unknown   LABS:              Recent Labs  06/04/15 1109 06/05/15 0449  WBC 10.9 10.3  HGB 11.4* 10.0*  PLT 254 193               Blood type: --/--/O POS (09/23 1110)  Rubella: Nonimmune (08/25 0000)                                  Physical Exam:             Alert and oriented X3  Lungs: Clear and unlabored  Heart: regular rate and rhythm / no mumurs  Abdomen: soft, non-tender, non-distended              Fundus: firm, non-tender, U-2  Perineum: intact, no significant edema, no ecchymosis, no erythema  Lochia: light, brown, no clots   Extremities: non-pitting dependent edema in LE, no calf pain or tenderness    A: PPD # 2  Doing well - stable status  Rubella Non-Immune  Hx of Depression  Mild ABL Anemia   P: Discharge home today   MMR vaccine prior to discharge  Continue home Iron supplements and Prenatal vitamin - plan to recheck CBC at 6 week PP visit   Continue Ibuprofen 600mg  q 6 hours  Educated on s/s of postpartum depression and importance of taking antidepressant medications when needed and to f/u if worsening s/s, unable to care for herself and baby, and/or suicidal ideation   F/U in 6 weeks with scheduled appointment at [facility]  Discharge instructions reviewed and all questions answered   Discussed  risks/benefits of birth control options - pt. Elects Depo injection - receive prior to discharge  Darliss Cheney, CNM

## 2015-06-06 NOTE — Discharge Summary (Signed)
POSTPARTUM VAGINAL DELIVERY DISCHARGE SUMMARY:  Patient ID: Kristen Good MRN: [id number] DOB/AGE: 01/12/1986 29 y.o.  Admit date: 06/04/2015 Admission Diagnoses:  active labor at 39+6 weeks  Discharge date:  06/06/15 Discharge Diagnoses: Postpartum care following vaginal delivery   Prenatal history: G2P1001   EDC : 06/05/2015, Alternate EDD Entry  Prenatal care at [facility] Primary provider : Dr. Sallie Patel  Prenatal course complicated byTobacco abuse, history of a PPH with last delivery in 2010 at 40+ weeks requiring 2 units PRBC transfusion, history of depression on Zoloft and Prozac  Prenatal Labs: ABO, Rh: --/--/O POS (09/23 1110)  Antibody: NEG (09/23 1109) Rubella: Nonimmune (08/25 0000)  / MMR booster: needs prior to discharge RPR: Non Reactive (09/23 1109)  HBsAg:   Negative (11/10/14) HIV:   Non-Reactive (11/10/14) GTT : 131 (03/04/15) GBS: Negative (08/25 0000) ABO, Rh:  O positive (11/10/14)  Medical / Surgical History :  Past medical history: History reviewed. No pertinent past medical history.  Past surgical history: History reviewed. No pertinent past surgical history.  Family History: History reviewed. No pertinent family history.  Social History:  reports that she has been smoking Cigarettes.  She has been smoking about 0.25 packs per day. She does not have any smokeless tobacco history on file. She reports that she does not drink alcohol or use illicit drugs.  Allergies: Review of patient's allergies indicates no known allergies.   Current Medications at time of admission:  Prior to Admission medications   Medication Sig Start Date End Date Taking? Authorizing Provider  ferrous fumarate (HEMOCYTE - 106 MG FE) 325 (106 FE) MG TABS tablet Take 1 tablet by mouth.   Yes Historical Provider, MD  Prenatal Vit-Fe Fumarate-FA (PRENATAL MULTIVITAMIN) TABS tablet Take 1 tablet by mouth daily at 12 noon.   Yes Historical Provider, MD  ibuprofen (ADVIL,MOTRIN) 600 MG  tablet Take 1 tablet (600 mg total) by mouth every 6 (six) hours. 06/06/15   Meredith C Sigmon, CNM    Intrapartum Course:  Admit for active labor at 39+[redacted]weeks gestation with labor progression to complete dilation  Pain management: none Complicated by: none Interventions required: none Delivery of a viable female at 1052 by Meredith Sigmon, CNM in LOA position   APGAR (1 MIN): 9  APGAR (5 MINS):  9 Formula and Breast feeding  Postpartum course:  Uncomplicated with discharge on POD #2 Complicated by: mild ABL Anemia  Rubella Non-Immune - receive MMR vaccine prior to discharge  Discharge Instructions:  Discharged Condition: good  Activity: pelvic rest   Diet: routine and Regular   Birth Control: medroxyPROGESTERone (DEPO-PROVERA) injection 150 mg IM x 1 today  Medications:    Medication List    TAKE these medications        ferrous fumarate 325 (106 FE) MG Tabs tablet  Commonly known as:  HEMOCYTE - 106 mg FE  Take 1 tablet by mouth.     ibuprofen 600 MG tablet  Commonly known as:  ADVIL,MOTRIN  Take 1 tablet (600 mg total) by mouth every 6 (six) hours.      ASK your doctor about these medications        prenatal multivitamin Tabs tablet  Take 1 tablet by mouth daily at 12 noon.      medroxyPROGESTERone (DEPO-PROVERA) injection 150 mg IM today prior to discharge   Postpartum Instructions:  Discharge Instructions    Activity as tolerated    Complete by:  As directed      Call MD for:    difficulty breathing, headache or visual disturbances    Complete by:  As directed      Call MD for:  extreme fatigue    Complete by:  As directed      Call MD for:  hives    Complete by:  As directed      Call MD for:  persistant dizziness or light-headedness    Complete by:  As directed      Call MD for:  persistant nausea and vomiting    Complete by:  As directed      Call MD for:  redness, tenderness, or signs of infection (pain, swelling, redness, odor or green/yellow  discharge around incision site)    Complete by:  As directed      Call MD for:  severe uncontrolled pain    Complete by:  As directed      Call MD for:  temperature >100.4    Complete by:  As directed      Call MD for:    Complete by:  As directed   Worsening s/s of depression, unable to care for herself or baby, and/or suicidal ideation     Diet - low sodium heart healthy    Complete by:  As directed      Sexual acrtivity    Complete by:  As directed   No intercourse until 6 week appointment          Discharge to: Home  Follow up :  [facility] for routine postpartum visit with Dr. Patel in 6 weeks or worsening s/s  At that appointment, plan to schedule next Depo appointment           Signed:  Dr. Beasley aware and agrees with plan of care  Meredith C Sigmon, CNM  

## 2015-11-01 ENCOUNTER — Emergency Department: Payer: Medicaid Other

## 2015-11-01 ENCOUNTER — Encounter: Payer: Self-pay | Admitting: Emergency Medicine

## 2015-11-01 DIAGNOSIS — M25531 Pain in right wrist: Secondary | ICD-10-CM | POA: Insufficient documentation

## 2015-11-01 DIAGNOSIS — F17210 Nicotine dependence, cigarettes, uncomplicated: Secondary | ICD-10-CM | POA: Insufficient documentation

## 2015-11-01 DIAGNOSIS — Z79899 Other long term (current) drug therapy: Secondary | ICD-10-CM | POA: Diagnosis not present

## 2015-11-01 NOTE — ED Notes (Addendum)
Patient ambulatory to triage with steady gait, without difficulty or distress noted; pt reports right wrist pain noted x2wks with no known injury

## 2015-11-01 NOTE — ED Notes (Signed)
No answer when called from lobby 

## 2015-11-02 ENCOUNTER — Emergency Department
Admission: EM | Admit: 2015-11-02 | Discharge: 2015-11-02 | Disposition: A | Discharge status: Home / Self Care | Payer: Medicaid Other | Attending: Emergency Medicine | Admitting: Emergency Medicine

## 2015-11-02 DIAGNOSIS — M25531 Pain in right wrist: Secondary | ICD-10-CM

## 2015-11-02 MED ORDER — IBUPROFEN 800 MG PO TABS
800.0000 mg | ORAL_TABLET | Freq: Once | ORAL | Status: AC
  Administered 2015-11-02: 800 mg via ORAL
  Filled 2015-11-02: qty 1

## 2015-11-02 MED ORDER — IBUPROFEN 800 MG PO TABS: 800.0000 mg | ORAL_TABLET | Freq: Three times a day (TID) | ORAL | Status: DC | PRN

## 2015-11-02 NOTE — ED Provider Notes (Signed)
[facility] Emergency Department Provider Note  ____________________________________________  Time seen: Approximately 3:35 AM  I have reviewed the triage vital signs and the nursing notes.   HISTORY  Chief Complaint Wrist Pain    HPI Kristen Good is a 30 y.o. female who comes into the hospital today with right-sided wrist pain. She reports it started 2 weeks ago. She woke up and it was painful from her thumb to her wrist. The patient denies any trauma. She has not done anything for pain or has she taken anything for pain. The patient came in tonight because the pain was really bad. She rates the pain a 9-10 out of 10 in intensity. She reports that she has a baby and she can't do anything due to her wrist. The patient reports that sometimes when she wakes up she feels the bone in her thumb moving around and she has swelling in her wrist. The patient is unsure was causing her symptoms.He reports that her thumb does not hurt currently but it's worse after she wakes up.   No past medical history  Patient Active Problem List   Diagnosis Date Noted  . Labor and delivery, indication for care 06/04/2015  . Postpartum care following vaginal delivery on (06/04/15) 06/04/2015    History reviewed. No pertinent past surgical history.  Current Outpatient Rx  Name  Route  Sig  Dispense  Refill  . ferrous fumarate (HEMOCYTE - 106 MG FE) 325 (106 FE) MG TABS tablet   Oral   Take 1 tablet by mouth.         Marland Kitchen ibuprofen (ADVIL,MOTRIN) 600 MG tablet   Oral   Take 1 tablet (600 mg total) by mouth every 6 (six) hours.   30 tablet   0   . ibuprofen (ADVIL,MOTRIN) 800 MG tablet   Oral   Take 1 tablet (800 mg total) by mouth every 8 (eight) hours as needed.   15 tablet   0   . Prenatal Vit-Fe Fumarate-FA (PRENATAL MULTIVITAMIN) TABS tablet   Oral   Take 1 tablet by mouth daily at 12 noon.           Allergies Review of patient's allergies indicates no known  allergies.  No family history on file.  Social History Social History  Substance Use Topics  . Smoking status: Current Every Day Smoker -- 0.25 packs/day    Types: Cigarettes  . Smokeless tobacco: None  . Alcohol Use: No    Review of Systems Constitutional: No fever/chills Eyes: No visual changes. ENT: No sore throat. Cardiovascular: Denies chest pain. Respiratory: Denies shortness of breath. Gastrointestinal: No abdominal pain.  No nausea, no vomiting.  No diarrhea.  No constipation. Genitourinary: Negative for dysuria. Musculoskeletal: Right wrist pain Skin: Negative for rash. Neurological: Negative for headaches, focal weakness or numbness.  10-point ROS otherwise negative.  ____________________________________________   PHYSICAL EXAM:  VITAL SIGNS: ED Triage Vitals  Enc Vitals Group     BP 11/01/15 2321 116/70 mmHg     Pulse Rate 11/01/15 2321 86     Resp 11/01/15 2321 20     Temp 11/01/15 2321 97.6 F (36.4 C)     Temp Source 11/01/15 2321 Oral     SpO2 11/01/15 2321 97 %     Weight --      Height 11/01/15 2321  (1.6 m)     Head Cir --      Peak Flow --      Pain Score  11/01/15 2319 8     Pain Loc --      Pain Edu? --      Excl. in GC? --     Constitutional: Alert and oriented. Well appearing and in mild distress. Eyes: Conjunctivae are normal. PERRL. EOMI. Head: Atraumatic. Nose: No congestion/rhinnorhea. Mouth/Throat: Mucous membranes are moist.  Oropharynx non-erythematous. Cardiovascular: Normal rate, regular rhythm. Grossly normal heart sounds.  Good peripheral circulation. Respiratory: Normal respiratory effort.  No retractions. Lungs CTAB. Gastrointestinal: Soft and nontender. No distention. Positive bowel sounds Musculoskeletal: No lower extremity tenderness nor edema.  Tenderness palpation of right wrist on the lateral side pain with passive range of motion, no pain with axial thumb load or with thumb movement. Neurologic:  Normal speech  and language.  Skin:  Skin is warm, dry and intact.  Psychiatric: Mood and affect are normal.   ____________________________________________   LABS (all labs ordered are listed, but only abnormal results are displayed)  Labs Reviewed - No data to display ____________________________________________  EKG  none ____________________________________________  RADIOLOGY  Right wrist x-ray: No evidence of fracture or dislocation. ____________________________________________   PROCEDURES  Procedure(s) performed: None  Critical Care performed: No  ____________________________________________   INITIAL IMPRESSION / ASSESSMENT AND PLAN / ED COURSE  Pertinent labs & imaging results that were available during my care of the patient were reviewed by me and considered in my medical decision making (see chart for details).  This is a 30 year old female who comes into the hospital today with some right wrist pain. The patient has been having this pain for 2 weeks but has not taken anything for the pain. The patient's x-ray does not show any acute injury or fracture. The patient has no pain with axial thumb load or with thumb range of motion but does have some pain with wrist range of motion. It is possible the patient may have some tenderness or ligamentous injury but I will place a prefabricated splint on the patient's right wrist and have her follow up with orthopedic surgery. I will also give the patient a dose of ibuprofen while here in the emergency department. The patient be discharged home to follow-up. ____________________________________________   FINAL CLINICAL IMPRESSION(S) / ED DIAGNOSES  Final diagnoses:  Right wrist pain      [name], MD 11/02/15 [phone]

## 2015-11-02 NOTE — Discharge Instructions (Signed)
Wrist Pain There are many things that can cause wrist pain. Some common causes include:  An injury to the wrist area, such as a sprain, strain, or fracture.  Overuse of the joint.  A condition that causes increased pressure on a nerve in the wrist (carpal tunnel syndrome).  Wear and tear of the joints that occurs with aging (osteoarthritis).  A variety of other types of arthritis. Sometimes, the cause of wrist pain is not known. The pain often goes away when you follow your health care provider's instructions for relieving pain at home. If your wrist pain continues, tests may need to be done to diagnose your condition. HOME CARE INSTRUCTIONS Pay attention to any changes in your symptoms. Take these actions to help with your pain:  Rest the wrist area for at least 48 hours or as told by your health care provider.  If directed, apply ice to the injured area:  Put ice in a plastic bag.  Place a towel between your skin and the bag.  Leave the ice on for 20 minutes, 2-3 times per day.  Keep your arm raised (elevated) above the level of your heart while you are sitting or lying down.  If a splint or elastic bandage has been applied, use it as told by your health care provider.  Remove the splint or bandage only as told by your health care provider.  Loosen the splint or bandage if your fingers become numb or have a tingling feeling, or if they turn cold or blue.  Take over-the-counter and prescription medicines only as told by your health care provider.  Keep all follow-up visits as told by your health care provider. This is important. SEEK MEDICAL CARE IF:  Your pain is not helped by treatment.  Your pain gets worse. SEEK IMMEDIATE MEDICAL CARE IF:  Your fingers become swollen.  Your fingers turn white, very red, or cold and blue.  Your fingers are numb or have a tingling feeling.  You have difficulty moving your fingers.   This information is not intended to replace  advice given to you by your health care provider. Make sure you discuss any questions you have with your health care provider.   Document Released: 06/07/2005 Document Revised: 05/19/2015 Document Reviewed: 01/13/2015 Elsevier Interactive Patient Education 2016 [employer]  

## 2017-02-16 DIAGNOSIS — F319 Bipolar disorder, unspecified: Secondary | ICD-10-CM

## 2017-02-16 HISTORY — DX: Bipolar disorder, unspecified: F31.9

## 2018-10-11 DIAGNOSIS — F329 Major depressive disorder, single episode, unspecified: Secondary | ICD-10-CM

## 2018-10-11 HISTORY — DX: Major depressive disorder, single episode, unspecified: F32.9

## 2018-10-11 LAB — HM HIV SCREENING LAB: HM HIV Screening: NEGATIVE

## 2019-08-22 ENCOUNTER — Ambulatory Visit: Payer: Self-pay

## 2019-08-29 DIAGNOSIS — F319 Bipolar disorder, unspecified: Secondary | ICD-10-CM

## 2019-09-01 ENCOUNTER — Ambulatory Visit (LOCAL_COMMUNITY_HEALTH_CENTER): Payer: Self-pay

## 2019-09-01 ENCOUNTER — Ambulatory Visit (LOCAL_COMMUNITY_HEALTH_CENTER): Payer: Self-pay | Admitting: Physician Assistant

## 2019-09-01 ENCOUNTER — Encounter: Payer: Self-pay | Admitting: Physician Assistant

## 2019-09-01 ENCOUNTER — Other Ambulatory Visit: Payer: Self-pay

## 2019-09-01 VITALS — BP 115/74 | Ht 63.0 in | Wt 215.0 lb

## 2019-09-01 DIAGNOSIS — Z3009 Encounter for other general counseling and advice on contraception: Secondary | ICD-10-CM | POA: Diagnosis not present

## 2019-09-01 DIAGNOSIS — Z01419 Encounter for gynecological examination (general) (routine) without abnormal findings: Secondary | ICD-10-CM

## 2019-09-01 DIAGNOSIS — Z3201 Encounter for pregnancy test, result positive: Secondary | ICD-10-CM

## 2019-09-01 DIAGNOSIS — Z23 Encounter for immunization: Secondary | ICD-10-CM

## 2019-09-01 DIAGNOSIS — Z113 Encounter for screening for infections with a predominantly sexual mode of transmission: Secondary | ICD-10-CM

## 2019-09-01 LAB — WET PREP FOR TRICH, YEAST, CLUE
Trichomonas Exam: NEGATIVE
Yeast Exam: NEGATIVE

## 2019-09-01 LAB — PREGNANCY, URINE: Preg Test, Ur: POSITIVE — AB

## 2019-09-01 MED ORDER — PRENATAL VITAMIN 27-0.8 MG PO TABS: 1.0000 | ORAL_TABLET | Freq: Every day | ORAL | 0 refills | Status: DC

## 2019-09-01 NOTE — Progress Notes (Signed)
Flu vaccine given. See encounter. Aileen Fass, RN

## 2019-09-01 NOTE — Progress Notes (Signed)
Pt here for Depo restart. Pt's last physical was 01/2018. Pt's last Depo was 10/11/2018. Pt also desires STD screening. Pt reports last period was ~07/12/2019, and pt reports periods are irregular.Ronny Bacon, RN

## 2019-09-01 NOTE — Progress Notes (Signed)
Family Planning Visit- Repeat Yearly Visit  Subjective:  Kristen Good is a 33 y.o. being seen today for an well woman visit and to discuss restarting Depo.    She is currently using none for pregnancy prevention. Patient reports she does not know if she or her partner wants a pregnancy in the next year. Patient  has Depression, major - aggressive and Bipolar I disorder ([place]) on their problem list.  Chief Complaint  Patient presents with  . Contraception    Depo  . SEXUALLY TRANSMITTED DISEASE    STD screening    Patient reports that she would like to restart Depo today.  Denies vaginal symptoms but would like to have STD screening.  LMP 07/12/2019 and normal with last sex around the same time of last period.  States that she occasionally smokes a cigarette but not regularly.  Patient denies any changes to personal or family history since last visit.   Does the patient desire a pregnancy in the next year? (OKQ flowsheet)  See flowsheet for other program required questions.   Body mass index is 38.09 kg/m. - Patient is eligible for diabetes screening based on BMI and age >93?  not applicable OI7T ordered? not applicable  Patient reports 1 of partners in last year. Desires STI screening?  Yes  Does the patient have a current or past history of drug use? No   No components found for: HCV]   Health Maintenance Due  Topic Date Due  . TETANUS/TDAP  12/19/2004  . PAP SMEAR-Modifier  12/20/2006    Review of Systems  All other systems reviewed and are negative.   The following portions of the patient's history were reviewed and updated as appropriate: allergies, current medications, past family history, past medical history, past social history, past surgical history and problem list. Problem list updated.  Objective:   Vitals:   09/01/19 0930  BP: 115/74  Weight: 215 lb (97.5 kg)  Height: 5\' 3"  (1.6 m)    Physical Exam Vitals reviewed.  Constitutional:      General:  She is not in acute distress.    Appearance: Normal appearance. She is obese.  HENT:     Head: Normocephalic and atraumatic.     Mouth/Throat:     Mouth: Mucous membranes are moist.     Pharynx: Oropharynx is clear. No oropharyngeal exudate or posterior oropharyngeal erythema.  Eyes:     Conjunctiva/sclera: Conjunctivae normal.  Pulmonary:     Effort: Pulmonary effort is normal.  Abdominal:     Palpations: Abdomen is soft. There is no mass.     Tenderness: There is no abdominal tenderness. There is no guarding or rebound.  Genitourinary:    General: Normal vulva.     Rectum: Normal.     Comments: External genitalia/pubic area without nits, lice, edema, erythema, lesions and inguinal adenopathy. Vagina with normal mucosa and discharge. Cervix without visible lesions. Uterus firm, mobile, nt, no masses, no CMT, no adnexal tenderness or fullness. Musculoskeletal:     Cervical back: Neck supple. No tenderness.  Lymphadenopathy:     Cervical: No cervical adenopathy.  Skin:    General: Skin is warm and dry.     Findings: No bruising, erythema, lesion or rash.  Neurological:     Mental Status: She is alert and oriented to person, place, and time.  Psychiatric:        Mood and Affect: Mood normal.        Behavior: Behavior normal.  Thought Content: Thought content normal.        Judgment: Judgment normal.       Assessment and Plan:  Kristen Good is a 33 y.o. female presenting to the [facility] Department for a family planning visit  Contraception counseling: Reviewed all forms of birth control options in the tiered based approach. available including abstinence; over the counter/barrier methods; hormonal contraceptive medication including pill, patch, ring, injection,contraceptive implant; hormonal and nonhormonal IUDs; permanent sterilization options including vasectomy and the various tubal sterilization modalities. Risks, benefits, and typical effectiveness  rates were reviewed.  Patient was not a candidate for ECP today.   1. Encounter for counseling regarding contraception Counseled patient that we will need to rule out pregnancy before restarting Depo today. Reviewed BCMs as above and rec that she use condoms with all sex for 2 weeks after restarting Depo and always for STD protection. - Pregnancy, urine  2. Screening for STD (sexually transmitted disease) Await test results.  Counseled that RN will call if needs to RTC for treatment once results are back. - WET PREP FOR TRICH, YEAST, CLUE - Chlamydia/Gonorrhea [facility] - HIV [place] [facility] - Syphilis Serology, [place]  3. Pap test, as part of routine gynecological examination Counseled that RN will call or send letter with pap results and plan in 2-3 weeks. - IGP, Aptima HPV  4. Pregnancy test-positive Patient with positive pregnancy test. Please give PNV 1 po QD to start today. Please give pregnancy resource sheet and proof of pregnancy sheet. Patient with PHQ-9=14 today and denies S or H ideation or plan.  Requests referral to LCSW and will be given LCSW and [employer] cards for follow up.     No follow-ups on file.  No future appointments.  Matt Holmes, PA

## 2019-09-01 NOTE — Progress Notes (Signed)
Flu vaccine given; tolerated well Kadijah Shamoon, RN  

## 2019-09-02 ENCOUNTER — Encounter: Payer: Self-pay | Admitting: Physician Assistant

## 2019-09-02 NOTE — Progress Notes (Addendum)
Wet mount reviewed and is negative today, so no treatment needed for wet mount per standing order. Urine PT is positive today. Positive PT packet, proof of pregnancy and PNV's given to pt. Pt with PHQ9 of 14 today, pt denies any thoughts of harming herself and denies any thoughts of suicide, and Antoine Primas, [place] made aware. Milton Ferguson, LCSW card and [employer] card with contact info given to pt per pt request and provider orders. Counseled pt that if she ever has any thoughts of suicide or harming herself to call 911 or seek help immediately, and pt states understanding. Pt with history of miscarriage and low iron and Antoine Primas, [place] made aware and recommends pt get into prenatal care as soon as possible and counseled pt on this. 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 Provider orders completed.Ronny Bacon, RN

## 2019-09-10 LAB — IGP, APTIMA HPV
HPV Aptima: NEGATIVE
PAP Smear Comment: 0

## 2019-09-22 ENCOUNTER — Other Ambulatory Visit: Payer: Self-pay

## 2019-09-22 ENCOUNTER — Inpatient Hospital Stay (HOSPITAL_COMMUNITY)
Admission: AD | Admit: 2019-09-22 | Discharge: 2019-09-22 | Disposition: A | Discharge status: Home / Self Care | Payer: BC Managed Care – PPO | Attending: Obstetrics & Gynecology | Admitting: Obstetrics & Gynecology

## 2019-09-22 ENCOUNTER — Encounter (HOSPITAL_COMMUNITY): Payer: Self-pay | Admitting: Obstetrics & Gynecology

## 2019-09-22 ENCOUNTER — Inpatient Hospital Stay (HOSPITAL_COMMUNITY): Payer: BC Managed Care – PPO

## 2019-09-22 DIAGNOSIS — Z6790 Unspecified blood type, Rh positive: Secondary | ICD-10-CM

## 2019-09-22 DIAGNOSIS — R109 Unspecified abdominal pain: Secondary | ICD-10-CM | POA: Diagnosis not present

## 2019-09-22 DIAGNOSIS — O26891 Other specified pregnancy related conditions, first trimester: Secondary | ICD-10-CM | POA: Insufficient documentation

## 2019-09-22 DIAGNOSIS — O26899 Other specified pregnancy related conditions, unspecified trimester: Secondary | ICD-10-CM

## 2019-09-22 DIAGNOSIS — Z3A10 10 weeks gestation of pregnancy: Secondary | ICD-10-CM | POA: Diagnosis not present

## 2019-09-22 DIAGNOSIS — O99331 Smoking (tobacco) complicating pregnancy, first trimester: Secondary | ICD-10-CM | POA: Insufficient documentation

## 2019-09-22 DIAGNOSIS — F319 Bipolar disorder, unspecified: Secondary | ICD-10-CM | POA: Insufficient documentation

## 2019-09-22 DIAGNOSIS — O021 Missed abortion: Secondary | ICD-10-CM

## 2019-09-22 DIAGNOSIS — O99341 Other mental disorders complicating pregnancy, first trimester: Secondary | ICD-10-CM | POA: Diagnosis not present

## 2019-09-22 HISTORY — DX: Depression, unspecified: F32.A

## 2019-09-22 LAB — URINALYSIS, ROUTINE W REFLEX MICROSCOPIC
Bilirubin Urine: NEGATIVE
Glucose, UA: NEGATIVE mg/dL
Ketones, ur: NEGATIVE mg/dL
Nitrite: NEGATIVE
Protein, ur: 30 mg/dL — AB
Specific Gravity, Urine: 1.021 (ref 1.005–1.030)
pH: 6 (ref 5.0–8.0)

## 2019-09-22 LAB — CBC
HCT: 34.4 % — ABNORMAL LOW (ref 36.0–46.0)
Hemoglobin: 11.2 g/dL — ABNORMAL LOW (ref 12.0–15.0)
MCH: 27.9 pg (ref 26.0–34.0)
MCHC: 32.6 g/dL (ref 30.0–36.0)
MCV: 85.6 fL (ref 80.0–100.0)
Platelets: 249 10*3/uL (ref 150–400)
RBC: 4.02 MIL/uL (ref 3.87–5.11)
RDW: 12.5 % (ref 11.5–15.5)
WBC: 6.1 10*3/uL (ref 4.0–10.5)
nRBC: 0 % (ref 0.0–0.2)

## 2019-09-22 LAB — WET PREP, GENITAL
Clue Cells Wet Prep HPF POC: NONE SEEN
Sperm: NONE SEEN
Trich, Wet Prep: NONE SEEN
Yeast Wet Prep HPF POC: NONE SEEN

## 2019-09-22 LAB — HCG, QUANTITATIVE, PREGNANCY: hCG, Beta Chain, Quant, S: 9569 m[IU]/mL — ABNORMAL HIGH (ref <5)

## 2019-09-22 MED ORDER — PROMETHAZINE HCL 25 MG PO TABS: 12.5000 mg | ORAL_TABLET | Freq: Four times a day (QID) | ORAL | 0 refills | Status: DC | PRN

## 2019-09-22 MED ORDER — OXYCODONE-ACETAMINOPHEN 5-325 MG PO TABS: 1.0000 | ORAL_TABLET | Freq: Four times a day (QID) | ORAL | 0 refills | Status: DC | PRN

## 2019-09-22 MED ORDER — IBUPROFEN 600 MG PO TABS: 600.0000 mg | ORAL_TABLET | Freq: Four times a day (QID) | ORAL | 0 refills | Status: DC | PRN

## 2019-09-22 MED ORDER — MISOPROSTOL 200 MCG PO TABS
800.0000 ug | ORAL_TABLET | Freq: Once | ORAL | Status: AC
  Administered 2019-09-22: 800 ug via ORAL
  Filled 2019-09-22: qty 4

## 2019-09-22 MED ORDER — IBUPROFEN 600 MG PO TABS
600.0000 mg | ORAL_TABLET | Freq: Four times a day (QID) | ORAL | Status: DC | PRN
  Administered 2019-09-22: 600 mg via ORAL
  Filled 2019-09-22: qty 1

## 2019-09-22 NOTE — MAU Note (Addendum)
Reports upper and side stomach pain that comes and goes but occurs everyday.  Pain is dull, crampy, and tightening.  Denies VB or abnormal discharge.

## 2019-09-22 NOTE — MAU Provider Note (Signed)
History     CSN: [id number]  Arrival date and time: 09/22/19 7741   First Provider Initiated Contact with Patient 09/22/19 2016      Chief Complaint  Patient presents with  . Abdominal Pain   33 y.o. [id number] @10 .5 wks by sure LMP presenting with abd pain. Pain started today. Located LLQ and epigastric. Rates pain 1/10. Has not taken anything for it. Denies urinary and GI sx. No fevers. Denies vaginal discharge and bleeding but reports vaginal itching.   OB History    Gravida  4   Para  2   Term  2   Preterm      AB  1   Living  2     SAB  1   TAB      Ectopic      Multiple  0   Live Births  2           Past Medical History:  Diagnosis Date  . Depression   . Mental disorder    bipolar and depression    History reviewed. No pertinent surgical history.  Family History  Problem Relation Age of Onset  . Asthma Daughter   . Asthma Half-Brother     Social History   Tobacco Use  . Smoking status: Current Some Day Smoker    Packs/day: 0.25    Types: Cigarettes  . Smokeless tobacco: Never Used  Substance Use Topics  . Alcohol use: Not Currently    Alcohol/week: 2.0 standard drinks    Types: 2 Cans of beer per week    Comment: just occassions, pt denies current use  . Drug use: No    Allergies: No Known Allergies  Medications Prior to Admission  Medication Sig Dispense Refill Last Dose  . Prenatal Vit-Fe Fumarate-FA (PRENATAL VITAMIN) 27-0.8 MG TABS Take 1 tablet by mouth daily. 100 tablet 0 09/22/2019 at Unknown time  . ferrous fumarate (HEMOCYTE - 106 MG FE) 325 (106 FE) MG TABS tablet Take 1 tablet by mouth.     11/20/2019 ibuprofen (ADVIL,MOTRIN) 600 MG tablet Take 1 tablet (600 mg total) by mouth every 6 (six) hours. (Patient not taking: Reported on 09/01/2019) 30 tablet 0   . ibuprofen (ADVIL,MOTRIN) 800 MG tablet Take 1 tablet (800 mg total) by mouth every 8 (eight) hours as needed. (Patient not taking: Reported on 09/01/2019) 15 tablet 0   .  Prenatal Vit-Fe Fumarate-FA (PRENATAL MULTIVITAMIN) TABS tablet Take 1 tablet by mouth daily at 12 noon.       Review of Systems  Constitutional: Negative for chills and fever.  Gastrointestinal: Positive for abdominal pain. Negative for constipation, diarrhea, nausea and vomiting.  Genitourinary: Negative for dysuria, vaginal bleeding and vaginal discharge.   Physical Exam   Blood pressure 115/87, pulse 98, temperature 98.2 F (36.8 C), resp. rate 19, weight 99.8 kg, last menstrual period 07/12/2019.  Physical Exam  Nursing note and vitals reviewed. Constitutional: She is oriented to person, place, and time. She appears well-developed and well-nourished. No distress.  HENT:  Head: Normocephalic and atraumatic.  Cardiovascular: Normal rate.  Respiratory: Effort normal. No respiratory distress.  GI: Soft. She exhibits no distension and no mass. There is no abdominal tenderness. There is no rebound and no guarding.  Genitourinary:    Genitourinary Comments: External: no lesions or erythema Vagina: rugated, pink, moist, scant brown bloody discharge Uterus: non enlarged, retroverted, non tender, no CMT Adnexae: no masses, no tenderness left, no tenderness right Cervix closed  Musculoskeletal:        General: Normal range of motion.     Cervical back: Normal range of motion.  Neurological: She is alert and oriented to person, place, and time.  Skin: Skin is warm and dry.  Psychiatric: She has a normal mood and affect.   Results for orders placed or performed during the hospital encounter of 09/22/19 (from the past 24 hour(s))  Urinalysis, Routine w reflex microscopic     Status: Abnormal   Collection Time: 09/22/19  7:43 PM  Result Value Ref Range   Color, Urine YELLOW YELLOW   APPearance CLOUDY (A) CLEAR   Specific Gravity, Urine 1.021 1.005 - 1.030   pH 6.0 5.0 - 8.0   Glucose, UA NEGATIVE NEGATIVE mg/dL   Hgb urine dipstick LARGE (A) NEGATIVE   Bilirubin Urine NEGATIVE  NEGATIVE   Ketones, ur NEGATIVE NEGATIVE mg/dL   Protein, ur 30 (A) NEGATIVE mg/dL   Nitrite NEGATIVE NEGATIVE   Leukocytes,Ua LARGE (A) NEGATIVE   RBC / HPF 0-5 0 - 5 RBC/hpf   WBC, UA 11-20 0 - 5 WBC/hpf   Bacteria, UA MANY (A) NONE SEEN   Squamous Epithelial / LPF 21-50 0 - 5   Mucus PRESENT   CBC     Status: Abnormal   Collection Time: 09/22/19  8:27 PM  Result Value Ref Range   WBC 6.1 4.0 - 10.5 K/uL   RBC 4.02 3.87 - 5.11 MIL/uL   Hemoglobin 11.2 (L) 12.0 - 15.0 g/dL   HCT 34.4 (L) 36.0 - 46.0 %   MCV 85.6 80.0 - 100.0 fL   MCH 27.9 26.0 - 34.0 pg   MCHC 32.6 30.0 - 36.0 g/dL   RDW 12.5 11.5 - 15.5 %   Platelets 249 150 - 400 K/uL   nRBC 0.0 0.0 - 0.2 %  Wet prep, genital     Status: Abnormal   Collection Time: 09/22/19  8:28 PM  Result Value Ref Range   Yeast Wet Prep HPF POC NONE SEEN NONE SEEN   Trich, Wet Prep NONE SEEN NONE SEEN   Clue Cells Wet Prep HPF POC NONE SEEN NONE SEEN   WBC, Wet Prep HPF POC MODERATE (A) NONE SEEN   Sperm NONE SEEN     US OB Comp Less 14 Wks  Result Date: 09/22/2019 CLINICAL DATA:  Left abdominal pain EXAM: OBSTETRIC <14 WK ULTRASOUND TECHNIQUE: Transabdominal ultrasound was performed for evaluation of the gestation as well as the maternal uterus and adnexal regions. COMPARISON:  None. FINDINGS: Intrauterine gestational sac: Single Yolk sac:  Not visualized Embryo:  Visualized Cardiac Activity: Not visualized Heart Rate:  bpm MSD:    mm    w     d CRL:   23.7 mm   9 w 0 d                  [place] EDC: Subchorionic hemorrhage:  None visualized. Maternal uterus/adnexae: No adnexal mass or free fluid. IMPRESSION: Nine week intrauterine pregnancy. No fetal cardiac activity. Findings meet definitive criteria for failed pregnancy. This follows SRU consensus guidelines: Diagnostic Criteria for Nonviable Pregnancy Early in the First Trimester. Alison Stalling J Med [phone]. Electronically Signed   By: Rolm Baptise M.D.   On: 09/22/2019 20:49   MAU Course   Procedures Early Intrauterine Pregnancy Failure Protocol X  Documented intrauterine pregnancy failure less than or equal to [redacted] weeks gestation  X  No serious current illness  X  Baseline Hgb  greater than or equal to 10g/dl  X  Patient has easily accessible transportation to the hospital  X  Clear preference  X  Practitioner/physician deems patient reliable  X  Counseling by practitioner or physician  X  Patient education by RN       Rho-Gam given by RN if indicated  X  Medication dispensed  X  Cytotec 800 mcg po      Intravaginally by provider in MAU       Rectally by patient at home       Rectally by RN in MAU  __ Intravaginally by patient at home X   Ibuprofen 600 mg 1 tablet by mouth every 6 hours as needed - prescribed  X   Percocet 5/325 1-2 tabs every 6 hours as needed - prescribed  X   Phenergan 25 mg by mouth every 6 hours as needed for nausea - prescribed  MDM Labs and [place] ordered and reviewed. Failed pregnancy confirmed. Discussed options with pt expectant vs medical vs surgical, pt prefers medical mngt today. Reviewed with pt cytotec procedure. Pt verbalizes that she lives close to the hospital and has transportation readily available. Pt appears reliable and verbalizes understanding and agrees with plan of care.  Assessment and Plan   1. Missed abortion   2. Abdominal pain affecting pregnancy   3. Blood type, Rh positive    Discharge home Follow up at [facility] in 1 week [facility] x2-3 days- note provided Bleeding/return precautions Rx Ibuprofen Rx Phenergan Rx Percocet  Allergies as of 09/22/2019   No Known Allergies     Medication List    STOP taking these medications   ferrous fumarate 325 (106 Fe) MG Tabs tablet Commonly known as: HEMOCYTE - 106 mg FE   Prenatal Vitamin 27-0.8 MG Tabs     TAKE these medications   ibuprofen 600 MG tablet Commonly known as: ADVIL Take 1 tablet (600 mg total) by mouth every 6 (six) hours as needed for mild pain, moderate  pain or cramping. What changed:   when to take this  reasons to take this  Another medication with the same name was removed. Continue taking this medication, and follow the directions you see here.   oxyCODONE-acetaminophen 5-325 MG tablet Commonly known as: PERCOCET/ROXICET Take 1 tablet by mouth every 6 (six) hours as needed for severe pain.   prenatal multivitamin Tabs tablet Take 1 tablet by mouth daily at 12 noon.   promethazine 25 MG tablet Commonly known as: PHENERGAN Take 0.5-1 tablets (12.5-25 mg total) by mouth every 6 (six) hours as needed for nausea or vomiting.      Donette Larry, CNM 09/22/2019, 9:07 PM

## 2019-09-23 ENCOUNTER — Telehealth: Payer: Self-pay | Admitting: Certified Nurse Midwife

## 2019-09-23 NOTE — Telephone Encounter (Signed)
Attempted to reach patient about scheduling an appointment with [place] for a follow up visit from MAU for MAB, got Cytotec. Needs f/u in 1 week. Left a message for her to call us and get scheduled.

## 2019-09-25 ENCOUNTER — Ambulatory Visit: Payer: BC Managed Care – PPO | Admitting: Licensed Clinical Social Worker

## 2019-10-07 ENCOUNTER — Encounter (HOSPITAL_COMMUNITY): Payer: Self-pay | Admitting: Family Medicine

## 2019-10-07 ENCOUNTER — Inpatient Hospital Stay (HOSPITAL_COMMUNITY)
Admission: AD | Admit: 2019-10-07 | Discharge: 2019-10-07 | Disposition: A | Discharge status: Home / Self Care | Payer: BC Managed Care – PPO | Attending: Family Medicine | Admitting: Family Medicine

## 2019-10-07 ENCOUNTER — Other Ambulatory Visit: Payer: Self-pay

## 2019-10-07 DIAGNOSIS — O039 Complete or unspecified spontaneous abortion without complication: Secondary | ICD-10-CM | POA: Diagnosis not present

## 2019-10-07 DIAGNOSIS — F17210 Nicotine dependence, cigarettes, uncomplicated: Secondary | ICD-10-CM | POA: Diagnosis not present

## 2019-10-07 LAB — CBC
HCT: 31.5 % — ABNORMAL LOW (ref 36.0–46.0)
Hemoglobin: 10.2 g/dL — ABNORMAL LOW (ref 12.0–15.0)
MCH: 27.9 pg (ref 26.0–34.0)
MCHC: 32.4 g/dL (ref 30.0–36.0)
MCV: 86.3 fL (ref 80.0–100.0)
Platelets: 290 10*3/uL (ref 150–400)
RBC: 3.65 MIL/uL — ABNORMAL LOW (ref 3.87–5.11)
RDW: 12.8 % (ref 11.5–15.5)
WBC: 7.3 10*3/uL (ref 4.0–10.5)
nRBC: 0 % (ref 0.0–0.2)

## 2019-10-07 LAB — HCG, QUANTITATIVE, PREGNANCY: hCG, Beta Chain, Quant, S: 287 m[IU]/mL — ABNORMAL HIGH (ref <5)

## 2019-10-07 NOTE — MAU Note (Addendum)
Post cytotec. Was to have come last week. Was bleeding a little too much to come in. Little cramping, bleeding stopped yesterday. No fever.  Here for follow up

## 2019-10-07 NOTE — MAU Provider Note (Signed)
History     CSN: [id number]  Arrival date and time: 10/07/19 1755   First Provider Initiated Contact with Patient 10/07/19 1908      No chief complaint on file.  Kristen Good is a 34 y.o. [id number] who presents today for SAB follow up. She was to go to the clinic for this, but she did not know where to go. She would like to go back on Depo. She usually goes to the [place] for [place].   Vaginal Bleeding The patient's primary symptoms include pelvic pain. This is a new problem. The current episode started 1 to 4 weeks ago. The problem occurs intermittently. The problem has been resolved. The problem affects both sides. She is not pregnant. The vaginal discharge was normal. There has been no bleeding. Nothing aggravates the symptoms. She has tried nothing for the symptoms.    OB History    Gravida  4   Para  2   Term  2   Preterm      AB  1   Living  2     SAB  1   TAB      Ectopic      Multiple  0   Live Births  2           Past Medical History:  Diagnosis Date  . Depression   . Mental disorder    bipolar and depression    History reviewed. No pertinent surgical history.  Family History  Problem Relation Age of Onset  . Asthma Daughter   . Asthma Half-Brother     Social History   Tobacco Use  . Smoking status: Current Some Day Smoker    Packs/day: 0.25    Types: Cigarettes  . Smokeless tobacco: Never Used  Substance Use Topics  . Alcohol use: Not Currently    Alcohol/week: 2.0 standard drinks    Types: 2 Cans of beer per week    Comment: just occassions, pt denies current use  . Drug use: No    Allergies: No Known Allergies  Medications Prior to Admission  Medication Sig Dispense Refill Last Dose  . ibuprofen (ADVIL) 600 MG tablet Take 1 tablet (600 mg total) by mouth every 6 (six) hours as needed for mild pain, moderate pain or cramping. 30 tablet 0 Past Week at Unknown time  . oxyCODONE-acetaminophen  (PERCOCET/ROXICET) 5-325 MG tablet Take 1 tablet by mouth every 6 (six) hours as needed for severe pain. 6 tablet 0 Past Week at Unknown time  . Prenatal Vit-Fe Fumarate-FA (PRENATAL MULTIVITAMIN) TABS tablet Take 1 tablet by mouth daily at 12 noon.   Past Week at Unknown time  . promethazine (PHENERGAN) 25 MG tablet Take 0.5-1 tablets (12.5-25 mg total) by mouth every 6 (six) hours as needed for nausea or vomiting. 30 tablet 0     Review of Systems  Genitourinary: Positive for pelvic pain and vaginal bleeding.   Physical Exam   Blood pressure (!) 98/56, pulse 89, temperature 98.8 F (37.1 C), resp. rate 18, last menstrual period 07/12/2019, SpO2 100 %.  Physical Exam  Nursing note and vitals reviewed. Constitutional: She is oriented to person, place, and time. She appears well-developed and well-nourished. No distress.  HENT:  Head: Normocephalic.  Cardiovascular: Normal rate.  Respiratory: Effort normal.  GI: Soft. There is no abdominal tenderness. There is no rebound.  Neurological: She is alert and oriented to person, place, and time.  Skin: Skin is warm and dry.  Psychiatric: She has a normal mood and affect.     Results for orders placed or performed during the hospital encounter of 10/07/19 (from the past 24 hour(s))  hCG, quantitative, pregnancy     Status: Abnormal   Collection Time: 10/07/19  6:25 PM  Result Value Ref Range   hCG, Beta Chain, Quant, S 287 (H) <5 mIU/mL  CBC     Status: Abnormal   Collection Time: 10/07/19  6:25 PM  Result Value Ref Range   WBC 7.3 4.0 - 10.5 K/uL   RBC 3.65 (L) 3.87 - 5.11 MIL/uL   Hemoglobin 10.2 (L) 12.0 - 15.0 g/dL   HCT 33.3 (L) 54.5 - 62.5 %   MCV 86.3 80.0 - 100.0 fL   MCH 27.9 26.0 - 34.0 pg   MCHC 32.4 30.0 - 36.0 g/dL   RDW 63.8 93.7 - 34.2 %   Platelets 290 150 - 400 K/uL   nRBC 0.0 0.0 - 0.2 %    MAU Course  Procedures  MDM   Assessment and Plan   1. SAB (spontaneous abortion)    DC home Bleeding  precautions RX: none  Return to MAU as needed Message sent to the clinic to schedule a FU visit and for patient to start depo-provera   Follow-up Information    [facility] [place] [place] Follow up.   Specialty: Obstetrics and Gynecology Why: They will call you for a follow up appointment and for your depo provera injection  Contact information: [address], [address] [id number] m[place] [address] [phone]         Thressa Sheller DNP, CNM  10/07/19  8:03 PM

## 2019-10-22 ENCOUNTER — Encounter: Payer: Self-pay | Admitting: Nurse Practitioner

## 2019-10-22 ENCOUNTER — Ambulatory Visit (INDEPENDENT_AMBULATORY_CARE_PROVIDER_SITE_OTHER): Payer: BC Managed Care – PPO | Admitting: Nurse Practitioner

## 2019-10-22 ENCOUNTER — Other Ambulatory Visit: Payer: Self-pay

## 2019-10-22 VITALS — BP 107/78 | HR 75 | Ht 63.0 in | Wt 221.9 lb

## 2019-10-22 DIAGNOSIS — B9689 Other specified bacterial agents as the cause of diseases classified elsewhere: Secondary | ICD-10-CM | POA: Diagnosis not present

## 2019-10-22 DIAGNOSIS — Z30013 Encounter for initial prescription of injectable contraceptive: Secondary | ICD-10-CM

## 2019-10-22 DIAGNOSIS — O039 Complete or unspecified spontaneous abortion without complication: Secondary | ICD-10-CM | POA: Diagnosis not present

## 2019-10-22 DIAGNOSIS — Z113 Encounter for screening for infections with a predominantly sexual mode of transmission: Secondary | ICD-10-CM

## 2019-10-22 DIAGNOSIS — N898 Other specified noninflammatory disorders of vagina: Secondary | ICD-10-CM

## 2019-10-22 DIAGNOSIS — Z6839 Body mass index (BMI) 39.0-39.9, adult: Secondary | ICD-10-CM | POA: Diagnosis not present

## 2019-10-22 DIAGNOSIS — N760 Acute vaginitis: Secondary | ICD-10-CM | POA: Diagnosis not present

## 2019-10-22 MED ORDER — MEDROXYPROGESTERONE ACETATE 150 MG/ML IM SUSP
150.0000 mg | Freq: Once | INTRAMUSCULAR | Status: AC
  Administered 2019-10-22: 15:00:00 150 mg via INTRAMUSCULAR

## 2019-10-22 NOTE — Progress Notes (Signed)
GYNECOLOGY OFFICE VISIT NOTE   History:  34 y.o. [id number] here today for Follow up after SAB and use of cytotec. She did have heavy bleeding after cytotec and saw what she thought was a sac in the toilet - had a picture and it seemed to be a large blood clot.  Denies any abnormal vaginal discharge, bleeding, pelvic pain or other concerns.   Past Medical History:  Diagnosis Date  . Depression   . Mental disorder    bipolar and depression    History reviewed. No pertinent surgical history.  The following portions of the patient's history were reviewed and updated as appropriate: allergies, current medications, past family history, past medical history, past social history, past surgical history and problem list.   Health Maintenance:  Normal pap and negative HRHPV in Dec.2020.    Review of Systems:  Pertinent items noted in HPI and remainder of comprehensive ROS otherwise negative.  Objective:  Physical Exam BP 107/78   Pulse 75   Ht 5\' 3"  (1.6 m)   Wt 221 lb 14.4 oz (100.7 kg)   LMP 07/12/2019 (Within Days) Comment: normal period, has irregular period  Breastfeeding Unknown Comment: SAB  BMI 39.31 kg/m  CONSTITUTIONAL: Well-developed, well-nourished female in no acute distress.  HENT:  Normocephalic, atraumatic. External right and left ear normal.  EYES: Conjunctivae and EOM are normal. Pupils are equal, round.  No scleral icterus.  NECK: Normal range of motion, supple, no masses SKIN: Skin is warm and dry. No rash noted. Not diaphoretic. No erythema. No pallor. NEUROLOGIC: Alert and oriented to person, place, and time. Normal muscle tone coordination. No cranial nerve deficit noted. PSYCHIATRIC: Normal mood and affect. Normal behavior. Normal judgment and thought content. CARDIOVASCULAR: Normal heart rate noted RESPIRATORY: Effort and breath sounds normal, no problems with respiration noted ABDOMEN: Soft, no distention noted.   PELVIC: Deferred  Self swab done as client is  having worrisome vaginal discharge. MUSCULOSKELETAL: Normal range of motion. No edema noted.  Labs and Imaging 07/14/2019 OB Comp Less 14 Wks  Result Date: 09/22/2019 CLINICAL DATA:  Left abdominal pain EXAM: OBSTETRIC <14 WK ULTRASOUND TECHNIQUE: Transabdominal ultrasound was performed for evaluation of the gestation as well as the maternal uterus and adnexal regions. COMPARISON:  None. FINDINGS: Intrauterine gestational sac: Single Yolk sac:  Not visualized Embryo:  Visualized Cardiac Activity: Not visualized Heart Rate:  bpm MSD:    mm    w     d CRL:   23.7 mm   9 w 0 d                  11/20/2019 EDC: Subchorionic hemorrhage:  None visualized. Maternal uterus/adnexae: No adnexal mass or free fluid. IMPRESSION: Nine week intrauterine pregnancy. No fetal cardiac activity. Findings meet definitive criteria for failed pregnancy. This follows SRU consensus guidelines: Diagnostic Criteria for Nonviable Pregnancy Early in the First Trimester. [place] J Med [phone]. Electronically Signed   By: [id number] M.D.   On: 09/22/2019 20:49    Assessment & Plan:  1. SAB (spontaneous abortion) Checking BHCG today   - Beta hCG quant (ref lab); Future  2. Initiation of Depo Provera Given today - medroxyPROGESTERone (DEPO-PROVERA) injection 150 mg  3. Vaginal discharge Sefl swab done   Routine preventative health maintenance measures emphasized. Please refer to After Visit Summary for other counseling recommendations.   Return in about 3 months (around 01/19/2020) for Depo.   Total face-to-face time with patient: 20 minutes.  Over 50% of encounter was spent on counseling and coordination of care.  Earlie Server, RN, MSN, NP-BC Nurse Practitioner, [facility] for [employer], [facility] 10/22/2019 4:04 PM

## 2019-10-22 NOTE — Progress Notes (Signed)
Pt wants Depo shot. Stomach is cramping, with d/c, which is a Brownish Gray looking color. No irritation but no odor. Thinks she has BV.

## 2019-10-23 ENCOUNTER — Telehealth: Payer: Self-pay | Admitting: Obstetrics & Gynecology

## 2019-10-23 LAB — CERVICOVAGINAL ANCILLARY ONLY
Bacterial Vaginitis (gardnerella): POSITIVE — AB
Candida Glabrata: NEGATIVE
Candida Vaginitis: NEGATIVE
Chlamydia: NEGATIVE
Comment: NEGATIVE
Comment: NEGATIVE
Comment: NEGATIVE
Comment: NEGATIVE
Comment: NEGATIVE
Comment: NORMAL
Neisseria Gonorrhea: NEGATIVE
Trichomonas: NEGATIVE

## 2019-10-23 LAB — BETA HCG QUANT (REF LAB): hCG Quant: 59 m[IU]/mL

## 2019-10-23 MED ORDER — METRONIDAZOLE 500 MG PO TABS: 500.0000 mg | ORAL_TABLET | Freq: Two times a day (BID) | ORAL | 0 refills | Status: DC

## 2019-10-23 NOTE — Addendum Note (Signed)
Addended by: Currie Paris on: 10/23/2019 08:40 PM   Modules accepted: Orders

## 2019-10-23 NOTE — Telephone Encounter (Signed)
Called the patient to inform the upcoming depo injection per [place] as she stated she forgot to place the information in the wrap up notes. The patient's appointment is scheduled and an call attempt was completed. Left a voicemail informing of the upcoming appointment and if the time and date does not work please give our office a call back to reschedule the visit.

## 2019-10-24 ENCOUNTER — Telehealth: Payer: Self-pay

## 2019-10-24 NOTE — Telephone Encounter (Addendum)
-----   Message from Currie Paris, NP sent at 10/23/2019  8:40 PM EST ----- BV noted.  Medication sent to pharmacy for BV. And good drop noted in BHCG.  Will not need to follow quants further as had [place] that designated intrauterine pregnancy and client had miscarriage with cytotec.    Called pt and LM that I am calling with results.  If she could please give the office a return call back.

## 2019-10-27 NOTE — Telephone Encounter (Signed)
Called pt and left message that I am calling with results and that this is our second attempt if she could call the office and a letter will be sent.  Letter sent.

## 2020-01-08 ENCOUNTER — Ambulatory Visit: Payer: BC Managed Care – PPO

## 2020-01-12 ENCOUNTER — Ambulatory Visit: Payer: BC Managed Care – PPO

## 2020-01-14 ENCOUNTER — Ambulatory Visit: Payer: BC Managed Care – PPO

## 2020-02-28 ENCOUNTER — Ambulatory Visit (HOSPITAL_COMMUNITY)
Admission: EM | Admit: 2020-02-28 | Discharge: 2020-02-28 | Disposition: A | Discharge status: Home / Self Care | Payer: BC Managed Care – PPO | Attending: Family Medicine | Admitting: Family Medicine

## 2020-02-28 ENCOUNTER — Encounter (HOSPITAL_COMMUNITY): Payer: Self-pay

## 2020-02-28 ENCOUNTER — Other Ambulatory Visit: Payer: Self-pay

## 2020-02-28 DIAGNOSIS — W57XXXA Bitten or stung by nonvenomous insect and other nonvenomous arthropods, initial encounter: Secondary | ICD-10-CM

## 2020-02-28 DIAGNOSIS — L03311 Cellulitis of abdominal wall: Secondary | ICD-10-CM

## 2020-02-28 DIAGNOSIS — S30861A Insect bite (nonvenomous) of abdominal wall, initial encounter: Secondary | ICD-10-CM | POA: Diagnosis not present

## 2020-02-28 MED ORDER — DOXYCYCLINE HYCLATE 100 MG PO CAPS: 100.0000 mg | ORAL_CAPSULE | Freq: Two times a day (BID) | ORAL | 0 refills | Status: DC

## 2020-02-28 MED ORDER — IBUPROFEN 800 MG PO TABS: 800.0000 mg | ORAL_TABLET | Freq: Three times a day (TID) | ORAL | 0 refills | Status: DC

## 2020-02-28 NOTE — ED Triage Notes (Signed)
Pt presents to UC with swelling, pain and redness sin the right flank, after she was bitten by spider 3 days ago.

## 2020-03-01 ENCOUNTER — Ambulatory Visit: Payer: BC Managed Care – PPO

## 2020-03-01 NOTE — ED Provider Notes (Signed)
[place]   [id number] 02/28/20 Arrival Time: 2505  ASSESSMENT & PLAN:  1. Insect bite of abdominal wall, initial encounter   2. Cellulitis of abdominal wall     Area of cellulitis outlined here. Close observation. No abscess identified.   Begin: Meds ordered this encounter  Medications  . doxycycline (VIBRAMYCIN) 100 MG capsule    Sig: Take 1 capsule (100 mg total) by mouth 2 (two) times daily.    Dispense:  20 capsule    Refill:  0  . ibuprofen (ADVIL) 800 MG tablet    Sig: Take 1 tablet (800 mg total) by mouth 3 (three) times daily with meals.    Dispense:  21 tablet    Refill:  0     Follow-up Information    [facility].   Specialty: Urgent Care Why: If worsening or failing to improve as anticipated. Contact information: [place] [phone]              Reviewed expectations re: course of current medical issues. Questions answered. Outlined signs and symptoms indicating need for more acute intervention. Patient verbalized understanding. After Visit Summary given.   SUBJECTIVE:  Kristen Good is a 34 y.o. female who presents with a skin complaint. Questions insect/spider bite to R side of abdomen. Gradually increasing area of erythema and soreness and warmth first noted 2-3 d ago. No drainage or bleeding. Some fatigue. No fever reported. Normal PO intake without n/v/d. No OTC tx.     OBJECTIVE: Vitals:   02/28/20 1626  BP: 129/83  Pulse: (!) 127  Resp: (!) 21  Temp: 98.1 F (36.7 C)  TempSrc: Oral  SpO2: 99%    Recheck RR: 18  General appearance: alert; no distress but appears uncomfortable HEENT: [place]; AT Neck: supple with FROM Heart: regular; tachycardia noted Extremities: no edema; moves all extremities normally Skin: warm and dry; R side of abdomen with approx 4x5 inch area of erythema and skin thickening; tender to touch; warm to touch; no areas of  fluctuance Psychological: alert and cooperative; normal mood and affect  No Known Allergies  Past Medical History:  Diagnosis Date  . Depression   . Mental disorder    bipolar and depression   Social History   Socioeconomic History  . Marital status: Single    Spouse name: Not on file  . Number of children: Not on file  . Years of education: Not on file  . Highest education level: Not on file  Occupational History  . Not on file  Tobacco Use  . Smoking status: Current Some Day Smoker    Packs/day: 0.25    Types: Cigarettes  . Smokeless tobacco: Never Used  Vaping Use  . Vaping Use: Never used  Substance and Sexual Activity  . Alcohol use: Not Currently    Alcohol/week: 2.0 standard drinks    Types: 2 Cans of beer per week    Comment: just occassions, pt denies current use  . Drug use: No  . Sexual activity: Yes    Birth control/protection: Injection  Other Topics Concern  . Not on file  Social History Narrative  . Not on file   Social Determinants of Health   Financial Resource Strain:   . Difficulty of Paying Living Expenses:   Food Insecurity:   . Worried About Charity fundraiser in the Last Year:   . [place] in the Last Year:  Transportation Needs:   . Freight forwarder (Medical):   Marland Kitchen Lack of Transportation (Non-Medical):   Physical Activity:   . Days of Exercise per Week:   . Minutes of Exercise per Session:   Stress:   . Feeling of Stress :   Social Connections:   . Frequency of Communication with Friends and Family:   . Frequency of Social Gatherings with Friends and Family:   . Attends Religious Services:   . Active Member of Clubs or Organizations:   . Attends Banker Meetings:   Marland Kitchen Marital Status:   Intimate Partner Violence: Not At Risk  . Fear of Current or Ex-Partner: No  . Emotionally Abused: No  . Physically Abused: No  . Sexually Abused: No   Family History  Problem Relation Age of Onset  . Asthma  Daughter   . Asthma Half-Brother    History reviewed. No pertinent surgical history.   Mardella Layman, MD 03/01/20 1153

## 2020-03-03 ENCOUNTER — Ambulatory Visit (LOCAL_COMMUNITY_HEALTH_CENTER): Payer: Self-pay | Admitting: Family Medicine

## 2020-03-03 ENCOUNTER — Other Ambulatory Visit: Payer: Self-pay

## 2020-03-03 ENCOUNTER — Encounter: Payer: Self-pay | Admitting: Family Medicine

## 2020-03-03 VITALS — BP 120/72 | Ht 65.0 in | Wt 223.0 lb

## 2020-03-03 DIAGNOSIS — Z3009 Encounter for other general counseling and advice on contraception: Secondary | ICD-10-CM

## 2020-03-03 DIAGNOSIS — Z113 Encounter for screening for infections with a predominantly sexual mode of transmission: Secondary | ICD-10-CM

## 2020-03-03 DIAGNOSIS — N760 Acute vaginitis: Secondary | ICD-10-CM

## 2020-03-03 DIAGNOSIS — B9689 Other specified bacterial agents as the cause of diseases classified elsewhere: Secondary | ICD-10-CM

## 2020-03-03 LAB — WET PREP FOR TRICH, YEAST, CLUE
Trichomonas Exam: NEGATIVE
Yeast Exam: NEGATIVE

## 2020-03-03 LAB — PREGNANCY, URINE: Preg Test, Ur: NEGATIVE

## 2020-03-03 MED ORDER — METRONIDAZOLE 500 MG PO TABS: 500.0000 mg | ORAL_TABLET | Freq: Two times a day (BID) | ORAL | 0 refills | Status: AC

## 2020-03-03 MED ORDER — THERA VITAL M PO TABS: 1.0000 | ORAL_TABLET | Freq: Every day | ORAL | 0 refills | Status: DC

## 2020-03-03 NOTE — Progress Notes (Signed)
Pt reports she is here for physical, STD screen and a pregnancy test. Pt reports last sex was 02/29/2020 without condom. Pt filling out PHQ9. Pt reports she is not interested in birth control today.

## 2020-03-03 NOTE — Progress Notes (Signed)
Family Planning Visit- Repeat Yearly Visit  Subjective:  Kristen Good is a 34 y.o. [phone]  being seen today for an well woman visit and to discuss family planning options.    She is currently using none for pregnancy prevention. Patient reports she does not if she or her partner wants a pregnancy in the next year. Patient  has Depression, major - aggressive; Bipolar I disorder ([place]); and BMI 39.0-39.9,adult on their problem list.  Chief Complaint  Patient presents with   Gynecologic Exam    physical, STD screen, UPT    Patient reports she is here for annual exam, STD screen and PT.  She states that she doesn't use birth control since her miscarriage 09/2019.  She was given Depo in February but doesn't want to restart.  States she had unprotected sex 3 days ago and 2 week ago. Patient received a RX for her Seroquel on Monday at [facility].  She was there to have an abscess drained on her R side.  She states that she needs to make an appointment at [facility] for counseling and continuation of her meds.  Patient denies  Other concerns.  See flowsheet for other program required questions.   Body mass index is 37.11 kg/m. - Patient is eligible for diabetes screening based on BMI and age >74?  not applicable BS4H ordered? not applicable  Patient reports 1 of partners in last year. Desires STI screening?  Yes   Has patient been screened once for HCV in the past?  Yes  No results found for: HCVAB  Does the patient have current of drug use, have a partner with drug use, and/or has been incarcerated since last result? No  If yes-- Screen for HCV through [facility]   Does the patient meet criteria for HBV testing? No  Criteria:  -Household, sexual or needle sharing contact with HBV -History of drug use -HIV positive -Those with known Hep C   Health Maintenance Due  Topic Date Due   Hepatitis C Screening  Never done   COVID-19 Vaccine (1) Never done    Review of Systems   Genitourinary:       Thick white discharge that is a new finding for client.  All other systems reviewed and are negative.   The following portions of the patient's history were reviewed and updated as appropriate: allergies, current medications, past family history, past medical history, past social history, past surgical history and problem list. Problem list updated.  Objective:   Vitals:   03/03/20 1449  BP: 120/72  Weight: 223 lb (101.2 kg)  Height: 5\' 5"  (1.651 m)    Physical Exam    Assessment and Plan:  Kristen Good is a 34 y.o. female [phone] presenting to the [facility] Department for an yearly well woman exam/family planning visit  Contraception counseling: Reviewed all forms of birth control options in the tiered based approach. available including abstinence; over the counter/barrier methods; hormonal contraceptive medication including pill, patch, ring, injection,contraceptive implant, ECP; hormonal and nonhormonal IUDs; permanent sterilization options including vasectomy and the various tubal sterilization modalities. Risks, benefits, and typical effectiveness rates were reviewed.  Questions were answered.  Written information was also given to the patient to review.  Patient desires no birth control, this was prescribed for patient. She will follow up in  1 year for surveillance.  She was told to call with any further questions, or with any concerns about this method of contraception.  Emphasized use of  condoms 100% of the time for STI prevention.  Patient was offered ECP. ECP was not accepted by the patient. ECP counseling was not given - see RN documentation   1. Family planning Encourage client to est. PCP and Mental health provider to manage and treat chronic health and depression.  Given [employer] and [employer] [employer] cards.  Client declined referral to Kathreen Cosier.  Plans to contact [facility]. PHQ 9 = 14  2. General counseling and advice on  contraceptive management Client declines birth control at this time.  3. Screening examination for venereal disease  - WET PREP FOR TRICH, YEAST, CLUE - Chlamydia/Gonorrhea [facility] - HIV [place] - Syphilis Serology, [place] [facility]  4. Bacterial Vaginosis Metronidazole 500 mg 1 po BID x 7 days Co. No sexual activity x 7 days. Recommend condoms for STD prevention and birth control   No follow-ups on file.  No future appointments.  Larene Pickett, FNP

## 2020-03-03 NOTE — Progress Notes (Signed)
UPT is negative today. Wet mount reviewed with provider and pt treated for BV with Metronidazole per Larene Pickett, FNP verbal order. Pt received MVI's per pt request. Pt declines birth control today and is aware that if she changes her mind and decides to get on a birth control method to give [place] a call so she can RTC. Kathreen Cosier, LCSW and [employer] cards with contact info given to pt per pt request. Counseled pt per provider orders and pt states understanding. Provider orders completed.

## 2022-04-26 ENCOUNTER — Ambulatory Visit (HOSPITAL_COMMUNITY)
Admission: EM | Admit: 2022-04-26 | Discharge: 2022-04-26 | Disposition: A | Discharge status: Home / Self Care | Payer: Medicaid Other | Attending: Physician Assistant | Admitting: Physician Assistant

## 2022-04-26 ENCOUNTER — Encounter (HOSPITAL_COMMUNITY): Payer: Self-pay

## 2022-04-26 DIAGNOSIS — N926 Irregular menstruation, unspecified: Secondary | ICD-10-CM

## 2022-04-26 DIAGNOSIS — G5603 Carpal tunnel syndrome, bilateral upper limbs: Secondary | ICD-10-CM

## 2022-04-26 DIAGNOSIS — Z3201 Encounter for pregnancy test, result positive: Secondary | ICD-10-CM | POA: Diagnosis not present

## 2022-04-26 LAB — POC URINE PREG, ED: Preg Test, Ur: POSITIVE — AB

## 2022-04-26 NOTE — ED Provider Notes (Signed)
[facility]    CSN: [id number] Arrival date & time: 04/26/22  1625      History   Chief Complaint Chief Complaint  Patient presents with   Hand Pain    HPI Kristen Good is a 36 y.o. female.   Patient presents today with several concerns.  Her primary concern today is that she has missed her menstrual cycle.  She reports that for the past 2 months she has not had a regular menstrual cycle.  She previously was regular but did not think about it until recently.  She occasionally has some abdominal pain but is not currently experiencing any symptoms.  Denies any abdominal pain, pelvic pain, fever, nausea, vomiting.  She does not take any birth control.  She does report some alcohol and drug use with last occurrence in 03/14/2022.  She is not on medication on a regular basis.  Does not take over-the-counter medication regularly.  In addition, she reports a 7-year history of carpal tunnel since her second pregnancy.  This is in bilateral hands but worse on the right.  She is left-handed.  She does report some strenuous activity when she worked at [employer] a few years ago.  She denies any known trauma or change in activity recently.  She has been taking gabapentin and found this to be helpful and is requesting a refill of this medication today.  She has not seen a specialist.  She is able to form daily duties despite symptoms.    Past Medical History:  Diagnosis Date   Anemia    Depression    Mental disorder    bipolar and depression    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   Bipolar I disorder ([facility]) 02/16/2017    History reviewed. No pertinent surgical history.  OB History     Gravida  5   Para  2   Term  2   Preterm      AB  3   Living  2      SAB  3   IAB      Ectopic      Multiple  0   Live Births  2            Home Medications    Prior to Admission  medications   Medication Sig Start Date End Date Taking? Authorizing Provider  Multiple Vitamins-Minerals (MULTIVITAMIN) tablet Take 1 tablet by mouth daily. 03/03/20   Federico Flake, MD  Prenatal Vit-Fe Fumarate-FA (PRENATAL MULTIVITAMIN) TABS tablet Take 1 tablet by mouth daily at 12 noon. Patient not taking: Reported on 03/03/2020    [provider]    Family History Family History  Problem Relation Age of Onset   Asthma Daughter    Asthma Half-Brother     Social History Social History   Tobacco Use   Smoking status: Some Days    Packs/day: 0.25    Types: Cigarettes   Smokeless tobacco: Never   Tobacco comments:    smokes 3 cig.week  Vaping Use   Vaping Use: Never used  Substance Use Topics   Alcohol use: Yes    Alcohol/week: 2.0 standard drinks of alcohol    Types: 2 Cans of beer per week    Comment: just occassions, pt denies current use   Drug use: No     Allergies   Patient has no known allergies.   Review  of Systems Review of Systems  Constitutional:  Positive for activity change. Negative for appetite change, fatigue and fever.  Genitourinary:  Positive for menstrual problem.  Musculoskeletal:  Positive for myalgias. Negative for arthralgias.  Neurological:  Positive for numbness. Negative for dizziness, weakness, light-headedness and headaches.     Physical Exam Triage Vital Signs ED Triage Vitals  Enc Vitals Group     BP 04/26/22 1711 132/79     Pulse Rate 04/26/22 1711 87     Resp 04/26/22 1711 18     Temp 04/26/22 1711 98.3 F (36.8 C)     Temp Source 04/26/22 1711 Oral     SpO2 04/26/22 1711 98 %     Weight --      Height --      Head Circumference --      Peak Flow --      Pain Score 04/26/22 1712 7     Pain Loc --      Pain Edu? --      Excl. in GC? --    No data found.  Updated Vital Signs BP 132/79 (BP Location: Left Arm)   Pulse 87   Temp 98.3 F (36.8 C) (Oral)   Resp 18   LMP  (LMP Unknown)   SpO2 98%    Visual Acuity Right Eye Distance:   Left Eye Distance:   Bilateral Distance:    Right Eye Near:   Left Eye Near:    Bilateral Near:     Physical Exam Vitals reviewed.  Constitutional:      General: She is awake. She is not in acute distress.    Appearance: Normal appearance. She is well-developed. She is not ill-appearing.     Comments: Very pleasant female appears stated age in no acute distress sitting comfortably in exam room  HENT:     Head: Normocephalic and atraumatic.  Cardiovascular:     Rate and Rhythm: Normal rate and regular rhythm.     Pulses:          Radial pulses are 2+ on the right side and 2+ on the left side.     Heart sounds: Normal heart sounds, S1 normal and S2 normal. No murmur heard.    Comments: Capillary refill within 2 seconds bilateral fingers Pulmonary:     Effort: Pulmonary effort is normal.     Breath sounds: Normal breath sounds. No wheezing, rhonchi or rales.     Comments: Clear to auscultation bilaterally Abdominal:     General: Bowel sounds are normal.     Palpations: Abdomen is soft.     Tenderness: There is no abdominal tenderness. There is no right CVA tenderness, left CVA tenderness, guarding or rebound.     Comments: Benign abdominal exam  Musculoskeletal:     Right wrist: No tenderness, bony tenderness or snuff box tenderness.     Left wrist: No tenderness, bony tenderness or snuff box tenderness.     Right hand: There is no disruption of two-point discrimination. Normal capillary refill.     Left hand: There is no disruption of two-point discrimination. Normal capillary refill.     Comments: Hands/wrist: Positive Tinel's.  Hand neurovascularly intact bilaterally.  No deformity noted.  Pincer and grip strength normal.  Normal two-point discrimination.  Psychiatric:        Behavior: Behavior is cooperative.      UC Treatments / Results  Labs (all labs ordered are listed, but only abnormal results are displayed) Labs  Reviewed   POC URINE PREG, ED - Abnormal; Notable for the following components:      Result Value   Preg Test, Ur POSITIVE (*)    All other components within normal limits    EKG   Radiology No results found.  Procedures Procedures (including critical care time)  Medications Ordered in UC Medications - No data to display  Initial Impression / Assessment and Plan / UC Course  I have reviewed the triage vital signs and the nursing notes.  Pertinent labs & imaging results that were available during my care of the patient were reviewed by me and considered in my medical decision making (see chart for details).     Patient had a positive pregnancy test in clinic today.  Discussed that she would need to stop all alcohol, tobacco, drug use.  Recommended a over-the-counter prenatal vitamin.  Discussed that she should follow-up with OB/GYN.  She was given contact information.  Discussed that if she develops any abdominal pain, pelvic pain, abnormal bleeding she needs to go to the emergency room to which she expressed understanding.  Given positive pregnancy test we are limited in what we are able to prescribe for symptomatic relief.  She can use Tylenol.  Recommended conservative treatment measures including avoiding repetitive motion.  She was given braces for both hands with instruction to use these at night and then during the day if necessary.  She is to follow-up with sports medicine if symptoms persist.  Discussed that if she has any worsening symptoms she should be reevaluated.  Final Clinical Impressions(s) / UC Diagnoses   Final diagnoses:  Carpal tunnel syndrome, bilateral  Positive pregnancy test  Missed menses     Discharge Instructions      Your pregnancy test was positive.  Please follow-up with OB/GYN.  Start an over-the-counter prenatal vitamin.  Avoid alcohol, drug use, tobacco use.  If you have any abdominal pain, pelvic pain, vaginal bleeding, cramping need to go to the  emergency room.  We are placing braces on for comfort and support.  Use Tylenol for pain relief.  Follow-up with sports medicine if your symptoms persist.  If anything worsens return for reevaluation.     ED Prescriptions   None    PDMP not reviewed this encounter.   Jeani Hawking, PA-C 04/26/22 1812

## 2022-04-26 NOTE — ED Triage Notes (Signed)
Pt states hx of carpel tunnel to bilateral hands for over 81yrs, C/o increase pain and numbness in the last few months. Denies taking OTC meds, states took her moms gabapentin with relief.   Pt requesting a pregnancy test. Last cycle a few months ago which she has irregular cycles. States having nausea for 2 wks and abdominal pain x2 months.

## 2022-04-26 NOTE — Discharge Instructions (Signed)
Your pregnancy test was positive.  Please follow-up with OB/GYN.  Start an over-the-counter prenatal vitamin.  Avoid alcohol, drug use, tobacco use.  If you have any abdominal pain, pelvic pain, vaginal bleeding, cramping need to go to the emergency room.  We are placing braces on for comfort and support.  Use Tylenol for pain relief.  Follow-up with sports medicine if your symptoms persist.  If anything worsens return for reevaluation.

## 2022-05-04 ENCOUNTER — Other Ambulatory Visit: Payer: Self-pay

## 2022-05-04 ENCOUNTER — Encounter (HOSPITAL_COMMUNITY): Payer: Self-pay | Admitting: *Deleted

## 2022-05-04 ENCOUNTER — Inpatient Hospital Stay (HOSPITAL_COMMUNITY)
Admission: AD | Admit: 2022-05-04 | Discharge: 2022-05-04 | Disposition: A | Discharge status: Home / Self Care | Payer: Medicaid Other | Attending: Obstetrics and Gynecology | Admitting: Obstetrics and Gynecology

## 2022-05-04 DIAGNOSIS — Z3200 Encounter for pregnancy test, result unknown: Secondary | ICD-10-CM | POA: Insufficient documentation

## 2022-05-04 NOTE — MAU Provider Note (Signed)
[name] is a 36 y.o. [phone] pregnant  female at Unknown who presents to MAU today with complaint of pregnancy with uncertain dating and wanting to know viability. No physical complaints other than occasional nausea (none now).   Has not established routine OB care.  Pertinent items noted in HPI and remainder of comprehensive ROS otherwise negative.   O BP 123/71 (BP Location: Right Arm)   Pulse 85   Temp 98 F (36.7 C) (Oral)   Resp 18   Ht 5\' 3"  (1.6 m)   Wt 227 lb 1.6 oz (103 kg)   LMP  (LMP Unknown)   SpO2 99%   BMI 40.23 kg/m  Physical Exam Vitals and nursing note reviewed.  Constitutional:      Appearance: She is obese.  HENT:     Head: Normocephalic.  Eyes:     Pupils: Pupils are equal, round, and reactive to light.  Cardiovascular:     Rate and Rhythm: Normal rate and regular rhythm.  Pulmonary:     Effort: Pulmonary effort is normal.  Musculoskeletal:        General: Normal range of motion.  Skin:    General: Skin is warm and dry.     Capillary Refill: Capillary refill takes less than 2 seconds.  Neurological:     Mental Status: She is alert and oriented to person, place, and time.  Psychiatric:        Mood and Affect: Mood normal.        Behavior: Behavior normal.        Thought Content: Thought content normal.        Judgment: Judgment normal.    MDM/MAU Course: Explained that we do not perform non-emergent ultrasounds to confirm dating in MAU and offered to order dating ultrasound which pt accepted.  A Currently pregnant Medical screening exam complete  P Discharge from MAU in stable condition with first trimester precautions  Allergies as of 05/04/2022   No Known Allergies      Medication List     ASK your doctor about these medications    multivitamin tablet Take 1 tablet by mouth daily.   prenatal multivitamin Tabs tablet Take 1 tablet by mouth daily at 12 noon.        05/06/2022, Bernerd Limbo 05/05/2022 9:32 PM

## 2022-05-04 NOTE — MAU Note (Signed)
Kristen Good is a 36 y.o. at Unknown here in MAU reporting: she recently found out she was pregnant and wants to "make sure the baby is still alive".  Reports Hx of miscarriage in the past.  Denies VB or current pain, cramps intermittently. LMP: June 2023 Onset of complaint: today Pain score: 0 Vitals:   05/04/22 1854  BP: 123/71  Pulse: 85  Resp: 20  Temp: 98 F (36.7 C)  SpO2: 99%     FHT:N/A Lab orders placed from triage:   None

## 2022-05-05 ENCOUNTER — Other Ambulatory Visit: Payer: Self-pay | Admitting: Certified Nurse Midwife

## 2022-05-05 DIAGNOSIS — Z3490 Encounter for supervision of normal pregnancy, unspecified, unspecified trimester: Secondary | ICD-10-CM

## 2022-05-08 ENCOUNTER — Encounter (HOSPITAL_COMMUNITY): Payer: Self-pay | Admitting: Obstetrics and Gynecology

## 2022-05-08 ENCOUNTER — Observation Stay (HOSPITAL_COMMUNITY)
Admission: AD | Admit: 2022-05-08 | Discharge: 2022-05-09 | Disposition: A | Discharge status: Home / Self Care | Payer: Medicaid Other | Attending: Obstetrics and Gynecology | Admitting: Obstetrics and Gynecology

## 2022-05-08 ENCOUNTER — Inpatient Hospital Stay (HOSPITAL_COMMUNITY): Payer: Medicaid Other

## 2022-05-08 DIAGNOSIS — Z3A01 Less than 8 weeks gestation of pregnancy: Secondary | ICD-10-CM | POA: Diagnosis not present

## 2022-05-08 DIAGNOSIS — O209 Hemorrhage in early pregnancy, unspecified: Secondary | ICD-10-CM | POA: Diagnosis present

## 2022-05-08 DIAGNOSIS — F17210 Nicotine dependence, cigarettes, uncomplicated: Secondary | ICD-10-CM | POA: Diagnosis not present

## 2022-05-08 DIAGNOSIS — O00102 Left tubal pregnancy without intrauterine pregnancy: Principal | ICD-10-CM | POA: Insufficient documentation

## 2022-05-08 DIAGNOSIS — O009 Unspecified ectopic pregnancy without intrauterine pregnancy: Secondary | ICD-10-CM | POA: Diagnosis present

## 2022-05-08 DIAGNOSIS — O09521 Supervision of elderly multigravida, first trimester: Secondary | ICD-10-CM | POA: Insufficient documentation

## 2022-05-08 DIAGNOSIS — O99331 Smoking (tobacco) complicating pregnancy, first trimester: Secondary | ICD-10-CM | POA: Diagnosis not present

## 2022-05-08 LAB — URINALYSIS, ROUTINE W REFLEX MICROSCOPIC
Bilirubin Urine: NEGATIVE
Glucose, UA: NEGATIVE mg/dL
Ketones, ur: NEGATIVE mg/dL
Leukocytes,Ua: NEGATIVE
Nitrite: NEGATIVE
Protein, ur: 100 mg/dL — AB
RBC / HPF: >50 RBC/hpf — ABNORMAL HIGH (ref 0–5)
Specific Gravity, Urine: 1.03 (ref 1.005–1.030)
Squamous Epithelial / HPF: >50 — ABNORMAL HIGH (ref 0–5)
pH: 5 (ref 5.0–8.0)

## 2022-05-08 LAB — CBC
HCT: 33.3 % — ABNORMAL LOW (ref 36.0–46.0)
Hemoglobin: 11.2 g/dL — ABNORMAL LOW (ref 12.0–15.0)
MCH: 28.4 pg (ref 26.0–34.0)
MCHC: 33.6 g/dL (ref 30.0–36.0)
MCV: 84.3 fL (ref 80.0–100.0)
Platelets: 315 10*3/uL (ref 150–400)
RBC: 3.95 MIL/uL (ref 3.87–5.11)
RDW: 13.2 % (ref 11.5–15.5)
WBC: 8.9 10*3/uL (ref 4.0–10.5)
nRBC: 0 % (ref 0.0–0.2)

## 2022-05-08 LAB — WET PREP, GENITAL
Sperm: NONE SEEN
Trich, Wet Prep: NONE SEEN
WBC, Wet Prep HPF POC: <10 (ref <10)
Yeast Wet Prep HPF POC: NONE SEEN

## 2022-05-08 LAB — HCG, QUANTITATIVE, PREGNANCY: hCG, Beta Chain, Quant, S: 3113 m[IU]/mL — ABNORMAL HIGH (ref <5)

## 2022-05-08 NOTE — MAU Note (Signed)
Pt says she started VB this afternoon- on her underwear - pinkish- red None now Cramping started at 9pm Last sex- yesterday

## 2022-05-08 NOTE — MAU Provider Note (Cosign Needed)
History     [id number]  Arrival date and time: 05/08/22 2054    Chief Complaint  Patient presents with   Vaginal Bleeding     HPI Milanna Kozlov is a 36 y.o. at [redacted]w[redacted]d by unsure LMP who presents for vaginal bleeding. Symptoms started earlier today. Reports pink/red vaginal spotting. Not bleeding into a pad or passing blood clots. Reports some lower abdominal cramping & low back pain since bleeding started. Denies dysuria, vaginal discharge, or vaginal irritation. Last intercourse was 2 days ago.    Past Medical History:  Diagnosis Date   Anemia    Depression    Mental disorder    bipolar and depression    Past Surgical History:  Procedure Laterality Date   NO PAST SURGERIES      Family History  Problem Relation Age of Onset   Asthma Daughter    Asthma Half-Brother     No Known Allergies  No current facility-administered medications on file prior to encounter.   Current Outpatient Medications on File Prior to Encounter  Medication Sig Dispense Refill   Prenatal Vit-Fe Fumarate-FA (PRENATAL MULTIVITAMIN) TABS tablet Take 1 tablet by mouth daily at 12 noon. (Patient not taking: Reported on 03/03/2020)       ROS Pertinent positives and negative per HPI, all others reviewed and negative  Physical Exam   BP 109/64 (BP Location: Right Arm)   Pulse (!) 104   Temp 98 F (36.7 C) (Oral)   Resp 20   Ht 5\' 3"  (1.6 m)   Wt 102.6 kg   LMP 02/23/2022 (Approximate)   BMI 40.09 kg/m   Patient Vitals for the past 24 hrs:  BP Temp Temp src Pulse Resp Height Weight  05/08/22 2147 109/64 98 F (36.7 C) Oral (!) 104 20 -- --  05/08/22 2144 -- -- -- -- -- 5\' 3"  (1.6 m) 102.6 kg    Physical Exam Vitals and nursing note reviewed.  Constitutional:      General: She is not in acute distress.    Appearance: Normal appearance.  HENT:     Head: Normocephalic and atraumatic.  Eyes:     General: No scleral icterus.    Conjunctiva/sclera: Conjunctivae normal.  Pulmonary:      Effort: Pulmonary effort is normal. No respiratory distress.  Abdominal:     General: There is no distension.     Palpations: Abdomen is soft.     Tenderness: There is no abdominal tenderness. There is no guarding or rebound.  Neurological:     Mental Status: She is alert.  Psychiatric:        Mood and Affect: Mood normal.        Behavior: Behavior normal.     Labs Results for orders placed or performed during the hospital encounter of 05/08/22 (from the past 24 hour(s))  Wet prep, genital     Status: Abnormal   Collection Time: 05/08/22  9:52 PM  Result Value Ref Range   Yeast Wet Prep HPF POC NONE SEEN NONE SEEN   Trich, Wet Prep NONE SEEN NONE SEEN   Clue Cells Wet Prep HPF POC PRESENT (A) NONE SEEN   WBC, Wet Prep HPF POC <10 <10   Sperm NONE SEEN   Urinalysis, Routine w reflex microscopic Urine, Clean Catch     Status: Abnormal   Collection Time: 05/08/22  9:58 PM  Result Value Ref Range   Color, Urine AMBER (A) YELLOW   APPearance CLOUDY (A) CLEAR   Specific  Gravity, Urine 1.030 1.005 - 1.030   pH 5.0 5.0 - 8.0   Glucose, UA NEGATIVE NEGATIVE mg/dL   Hgb urine dipstick LARGE (A) NEGATIVE   Bilirubin Urine NEGATIVE NEGATIVE   Ketones, ur NEGATIVE NEGATIVE mg/dL   Protein, ur 409 (A) NEGATIVE mg/dL   Nitrite NEGATIVE NEGATIVE   Leukocytes,Ua NEGATIVE NEGATIVE   RBC / HPF >50 (H) 0 - 5 RBC/hpf   WBC, UA 6-10 0 - 5 WBC/hpf   Bacteria, UA FEW (A) NONE SEEN   Squamous Epithelial / LPF >50 (H) 0 - 5   Mucus PRESENT   CBC     Status: Abnormal   Collection Time: 05/08/22 10:03 PM  Result Value Ref Range   WBC 8.9 4.0 - 10.5 K/uL   RBC 3.95 3.87 - 5.11 MIL/uL   Hemoglobin 11.2 (L) 12.0 - 15.0 g/dL   HCT 81.1 (L) 91.4 - 78.2 %   MCV 84.3 80.0 - 100.0 fL   MCH 28.4 26.0 - 34.0 pg   MCHC 33.6 30.0 - 36.0 g/dL   RDW 95.6 21.3 - 08.6 %   Platelets 315 150 - 400 K/uL   nRBC 0.0 0.0 - 0.2 %  hCG, quantitative, pregnancy     Status: Abnormal   Collection Time: 05/08/22  10:03 PM  Result Value Ref Range   hCG, Beta Chain, Quant, S 3,113 (H) <5 mIU/mL    Imaging US OB LESS THAN 14 WEEKS WITH OB TRANSVAGINAL  Result Date: 05/08/2022 CLINICAL DATA:  Cramping, VB today. EXAM: OBSTETRIC <14 WK [place] AND TRANSVAGINAL OB US TECHNIQUE: Both transabdominal and transvaginal ultrasound examinations were performed for complete evaluation of the gestation as well as the maternal uterus, adnexal regions, and pelvic cul-de-sac. Transvaginal technique was performed to assess early pregnancy. COMPARISON:  09/22/2019. FINDINGS: Uterus: Cystic area superior to the endometrium measuring 0.6 x 0.6 x 0.6 cm with no fetal pole or yolk sac, possible pseudogestational sac. Endometrial thickness: 12 mm. An ectopic pregnancy is noted in the left adnexa lateral to the left ovary with gestational sac measuring 5.5 x 3.2 x 3.7 cm. No yolk sac is seen. A fetal pole is present with crown-rump length at 14.55 mm, estimated gestational age [redacted] weeks 6 days. No fetal cardiac activity is seen. A thick walled septated cyst is noted in the left ovary measuring 2.3 x 1.9 cm, likely corpus luteal cyst. No abnormal vascularity is seen. The right ovary is within normal limits. No free fluid in the pelvis. IMPRESSION: 1. Left adnexal ectopic pregnancy with gestational sac containing a fetal pole, estimated gestational age of [redacted] weeks 6 days. No fetal cardiac activity is seen. No free fluid in the pelvis. OBGYN consultation is recommended. Critical findings were reported to nurse practitioner Judeth Horn at 10:35 p.m. 2. Anechoic structure in the uterus, possible pseudogestational sac. 3. Corpus luteal cyst in the left ovary. Electronically Signed   By: Thornell Sartorius M.D.   On: 05/08/2022 22:56    MAU Course  Procedures Lab Orders         Wet prep, genital         CBC         hCG, quantitative, pregnancy         Urinalysis, Routine w reflex microscopic Urine, Clean Catch     No orders of the defined types were  placed in this encounter.  Imaging Orders         US OB LESS THAN 14 WEEKS WITH OB TRANSVAGINAL  MDM Ultrasound shows left ectopic pregnancy measuring [redacted]w[redacted]d. No cardiac activity. No free fluid.  Dr. Alysia Penna notified  Assessment and Plan   1. Left tubal pregnancy without intrauterine pregnancy    -Dr. Alysia Penna spoke with patient regarding plan for surgical removal of ectopic pregnancy  Judeth Horn, NP 05/08/22 11:32 PM  OB Attending Pt seen and examine. Last ate at 9 PM ( chicken necks and rice) Pt is stable and I do not see the need to proceed to surgery at this time Will await for complete NPO status and schedule for surgery in AM  Laparoscopic removal of ectopic pregnancy reviewed with pt. R/B/Post op care discussed. Pt verbalized understanding and agrees to proceed.  OR notified and pt has been added to add on schedule.  Nettie Elm, MD

## 2022-05-09 ENCOUNTER — Other Ambulatory Visit: Payer: Self-pay

## 2022-05-09 ENCOUNTER — Telehealth: Payer: Self-pay

## 2022-05-09 ENCOUNTER — Encounter (HOSPITAL_COMMUNITY): Payer: Self-pay | Admitting: Obstetrics and Gynecology

## 2022-05-09 ENCOUNTER — Encounter (HOSPITAL_COMMUNITY)
Admission: AD | Disposition: A | Discharge status: Home / Self Care | Payer: Self-pay | Source: Home / Self Care | Attending: Obstetrics and Gynecology

## 2022-05-09 DIAGNOSIS — O00102 Left tubal pregnancy without intrauterine pregnancy: Secondary | ICD-10-CM | POA: Diagnosis not present

## 2022-05-09 DIAGNOSIS — O009 Unspecified ectopic pregnancy without intrauterine pregnancy: Secondary | ICD-10-CM | POA: Diagnosis present

## 2022-05-09 LAB — GC/CHLAMYDIA PROBE AMP (~~LOC~~) NOT AT ARMC
Chlamydia: NEGATIVE
Comment: NEGATIVE
Comment: NORMAL
Neisseria Gonorrhea: NEGATIVE

## 2022-05-09 LAB — COMPREHENSIVE METABOLIC PANEL
ALT: 12 U/L (ref 0–44)
AST: 12 U/L — ABNORMAL LOW (ref 15–41)
Albumin: 2.9 g/dL — ABNORMAL LOW (ref 3.5–5.0)
Alkaline Phosphatase: 41 U/L (ref 38–126)
Anion gap: 5 (ref 5–15)
BUN: 7 mg/dL (ref 6–20)
CO2: 25 mmol/L (ref 22–32)
Calcium: 8.4 mg/dL — ABNORMAL LOW (ref 8.9–10.3)
Chloride: 108 mmol/L (ref 98–111)
Creatinine, Ser: 0.61 mg/dL (ref 0.44–1.00)
GFR, Estimated: >60 mL/min (ref >60)
Glucose, Bld: 90 mg/dL (ref 70–99)
Potassium: 3.4 mmol/L — ABNORMAL LOW (ref 3.5–5.1)
Sodium: 138 mmol/L (ref 135–145)
Total Bilirubin: 0.3 mg/dL (ref 0.3–1.2)
Total Protein: 6.1 g/dL — ABNORMAL LOW (ref 6.5–8.1)

## 2022-05-09 SURGERY — LAPAROSCOPY, DIAGNOSTIC
Anesthesia: General

## 2022-05-09 MED ORDER — POVIDONE-IODINE 10 % EX SWAB: 2.0000 | Freq: Once | CUTANEOUS | Status: DC

## 2022-05-09 MED ORDER — CEFAZOLIN SODIUM-DEXTROSE 2-4 GM/100ML-% IV SOLN: 2.0000 g | INTRAVENOUS | Status: DC

## 2022-05-09 MED ORDER — SOD CITRATE-CITRIC ACID 500-334 MG/5ML PO SOLN
30.0000 mL | ORAL | Status: AC
  Administered 2022-05-09: 30 mL via ORAL
  Filled 2022-05-09: qty 30

## 2022-05-09 MED ORDER — ACETAMINOPHEN 500 MG PO TABS
1000.0000 mg | ORAL_TABLET | ORAL | Status: AC
  Administered 2022-05-09: 1000 mg via ORAL
  Filled 2022-05-09: qty 2

## 2022-05-09 MED ORDER — LACTATED RINGERS IV SOLN: INTRAVENOUS | Status: DC

## 2022-05-09 MED ORDER — METHOTREXATE FOR ECTOPIC PREGNANCY
50.0000 mg/m2 | Freq: Once | INTRAMUSCULAR | Status: AC
  Administered 2022-05-09: 107.5 mg via INTRAMUSCULAR
  Filled 2022-05-09: qty 10

## 2022-05-09 NOTE — Telephone Encounter (Signed)
Pt is currently admitted on L&D for ectopic surgery. Kristen Good,RNC

## 2022-05-09 NOTE — Discharge Summary (Signed)
Gynecology Physician Postoperative Discharge Summary  Patient ID: Kristen Good MRN: [id number] DOB/AGE: Feb 23, 1986 36 y.o.  Admit Date: 05/08/2022 Discharge Date: 05/09/2022  Left tubal pregnancy without intrauterine pregnancy - Plan: Discharge patient, CANCELED: hCG, quantitative, pregnancy   Hospital Course:  Kristen Good is a 36 y.o. [id number]  admitted for ectopic pregnancy.   Presented to [facility] this evening for new onset vaginal bleeding, abdominal cramping, & back pain. Ultrasound shows left ectopic pregnancy. No signs of hemoperitoneum. Significant Labs:    Latest Ref Rng & Units 05/08/2022   10:03 PM 10/07/2019    6:25 PM 09/22/2019    8:27 PM  CBC  WBC 4.0 - 10.5 K/uL 8.9  7.3  6.1   Hemoglobin 12.0 - 15.0 g/dL 13.0  86.5  78.4   Hematocrit 36.0 - 46.0 % 33.3  31.5  34.4   Platelets 150 - 400 K/uL 315  290  249    hCG, Beta Chain, Quant, S 3,113 (H) <5 mIU/mL    Discharge Exam: Blood pressure 112/64, pulse 83, temperature 98.2 F (36.8 C), temperature source Oral, resp. rate 18, height 5\' 3"  (1.6 m), weight 102.6 kg, last menstrual period 02/23/2022, SpO2 100 %. General appearance: alert and no distress  Resp: clear to auscultation bilaterally  Cardio: regular rate and rhythm  GI: soft, non-tender; bowel sounds normal; no masses, no organomegaly.  Incision: C/D/I, no erythema, no drainage noted Pelvic: scant blood on pad  Extremities: extremities normal, atraumatic, no cyanosis or edema and Homans sign is negative, no sign of DVT  Discharged Condition: Stable Left tubal ectopic pregnancy, not ruptured, quant as above. We discussed surgical vs. Medical treatment as she is a MTX candidate and she requests medical management with outpatient f/u HCG at [facility]. The risks of methotrexate were reviewed including failure requiring repeat dosing or eventual surgery. She understands that methotrexate involves frequent return visits to monitor lab values and that she remains at risk of  ectopic rupture until her beta is less than assay. ?The patient opts to proceed with methotrexate.  She has no history of hepatic or renal dysfunction, has normal BUN/Cr/LFT's/platelets.  She is felt to be reliable for follow-up. Side effects of photosensitivity & GI upset were discussed.  She knows to avoid direct sunlight and abstain from alcohol, NSAIDs and sexual intercourse for two weeks. She was counseled to discontinue any MVI with folic acid. ?She understands to follow up on D4 (9/1) and D7 (9/4) for repeat BHCG and was given the instruction sheet. ?Strict ectopic precautions were reviewed, the patient knows to call with any abdominal pain, vomiting, fainting, or any concerns with her health.  Day 0/1 Day 4 Day 7  Sunday Wednesday Saturday  Monday Thursday Sunday  Tuesday Friday Monday  Wednesday Saturday Tuesday  Thursday Sunday Wednesday  Friday Monday Thursday  Saturday Tuesday Friday    Disposition: Discharge disposition: 01-Home or Self Care       Discharge Instructions     Discharge patient   Complete by: As directed    Discharge disposition: 01-Home or Self Care   Discharge patient date: 05/09/2022      Allergies as of 05/09/2022   No Known Allergies      Medication List     TAKE these medications    prenatal multivitamin Tabs tablet Take 1 tablet by mouth daily at 12 noon.        Follow-up Information     [facility] Follow  up on 05/12/2022.   Specialty: Obstetrics and Gynecology Why: HCG for ectopic f/u Contact information: [address] [place] [address] [phone]                Signed: Adam Phenix, MD, FACOG Obstetrician & Gynecologist Faculty Practice, [facility]

## 2022-05-09 NOTE — H&P (Addendum)
FACULTY PRACTICE ANTEPARTUM ADMISSION HISTORY AND PHYSICAL NOTE   History of Present Illness: Kristen Good is a 36 y.o. [id number] at [redacted]w[redacted]d admitted for ectopic pregnancy.   Presented to [facility] this evening for new onset vaginal bleeding, abdominal cramping, & back pain. Ultrasound shows left ectopic pregnancy. No signs of hemoperitoneum.    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   Bipolar I disorder (HCC) 02/16/2017    Past Medical History:  Diagnosis Date   Anemia    Depression    Mental disorder    bipolar and depression    Past Surgical History:  Procedure Laterality Date   NO PAST SURGERIES      OB History  Gravida Para Term Preterm AB Living  6 2 2   3 2   SAB IAB Ectopic Multiple Live Births  3     0 2    # Outcome Date GA Lbr Len/2nd Weight Sex Delivery Anes PTL Lv  6 Current           5 SAB 09/2019          4 Term 06/04/15 [redacted]w[redacted]d  3040 g F Vag-Spont None    3 Term 12/13/08 [redacted]w[redacted]d    Vag-Spont   LIV  2 SAB           1 SAB             Social History   Socioeconomic History   Marital status: Single    Spouse name: Not on file   Number of children: Not on file   Years of education: Not on file   Highest education level: Not on file  Occupational History   Not on file  Tobacco Use   Smoking status: Some Days    Packs/day: 0.25    Types: Cigarettes   Smokeless tobacco: Never   Tobacco comments:    smokes 3 cig.week  Vaping Use   Vaping Use: Never used  Substance and Sexual Activity   Alcohol use: Yes    Alcohol/week: 2.0 standard drinks of alcohol    Types: 2 Cans of beer per week    Comment: just occassions, pt denies current use   Drug use: No   Sexual activity: Yes    Partners: Male    Birth control/protection: None  Other Topics Concern   Not on file  Social History Narrative   Not on file   Social Determinants of Health   Financial Resource Strain:  Not on file  Food Insecurity: Not on file  Transportation Needs: Not on file  Physical Activity: Not on file  Stress: Not on file  Social Connections: Not on file    Family History  Problem Relation Age of Onset   Asthma Daughter    Asthma Half-Brother     No Known Allergies  Medications Prior to Admission  Medication Sig Dispense Refill Last Dose   Prenatal Vit-Fe Fumarate-FA (PRENATAL MULTIVITAMIN) TABS tablet Take 1 tablet by mouth daily at 12 noon. (Patient not taking: Reported on 03/03/2020)         Vitals:  BP 109/64 (BP Location: Right Arm)   Pulse (!) 104   Temp 98 F (36.7 C) (Oral)   Resp 20   Ht 5\' 3"  (1.6 m)   Wt 102.6 kg   LMP 02/23/2022 (Approximate)   BMI 40.09 kg/m  Physical Examination: CONSTITUTIONAL: Well-developed, well-nourished female in no acute distress.  HENT:  Normocephalic, atraumatic, External right and left ear normal. Oropharynx is clear and moist EYES: Conjunctivae and EOM are normal. Pupils are equal, round, and reactive to light. No scleral icterus.  NECK: Normal range of motion, supple, no masses SKIN: Skin is warm and dry. No rash noted. Not diaphoretic. No erythema. No pallor. NEUROLGIC: Alert and oriented to person, place, and time. Normal reflexes, muscle tone coordination. No cranial nerve deficit noted. PSYCHIATRIC: Normal mood and affect. Normal behavior. Normal judgment and thought content. CARDIOVASCULAR: Normal heart rate noted, regular rhythm RESPIRATORY: Effort and breath sounds normal, no problems with respiration noted ABDOMEN: Soft, nontender, nondistended, no guarding or rebound MUSCULOSKELETAL: Normal range of motion. No edema and no tenderness. 2+ distal pulses.   Labs:  Results for orders placed or performed during the hospital encounter of 05/08/22 (from the past 24 hour(s))  Wet prep, genital   Collection Time: 05/08/22  9:52 PM  Result Value Ref Range   Yeast Wet Prep HPF POC NONE SEEN NONE SEEN   Trich, Wet  Prep NONE SEEN NONE SEEN   Clue Cells Wet Prep HPF POC PRESENT (A) NONE SEEN   WBC, Wet Prep HPF POC <10 <10   Sperm NONE SEEN   Urinalysis, Routine w reflex microscopic Urine, Clean Catch   Collection Time: 05/08/22  9:58 PM  Result Value Ref Range   Color, Urine AMBER (A) YELLOW   APPearance CLOUDY (A) CLEAR   Specific Gravity, Urine 1.030 1.005 - 1.030   pH 5.0 5.0 - 8.0   Glucose, UA NEGATIVE NEGATIVE mg/dL   Hgb urine dipstick LARGE (A) NEGATIVE   Bilirubin Urine NEGATIVE NEGATIVE   Ketones, ur NEGATIVE NEGATIVE mg/dL   Protein, ur 841 (A) NEGATIVE mg/dL   Nitrite NEGATIVE NEGATIVE   Leukocytes,Ua NEGATIVE NEGATIVE   RBC / HPF >50 (H) 0 - 5 RBC/hpf   WBC, UA 6-10 0 - 5 WBC/hpf   Bacteria, UA FEW (A) NONE SEEN   Squamous Epithelial / LPF >50 (H) 0 - 5   Mucus PRESENT   CBC   Collection Time: 05/08/22 10:03 PM  Result Value Ref Range   WBC 8.9 4.0 - 10.5 K/uL   RBC 3.95 3.87 - 5.11 MIL/uL   Hemoglobin 11.2 (L) 12.0 - 15.0 g/dL   HCT 66.0 (L) 63.0 - 16.0 %   MCV 84.3 80.0 - 100.0 fL   MCH 28.4 26.0 - 34.0 pg   MCHC 33.6 30.0 - 36.0 g/dL   RDW 10.9 32.3 - 55.7 %   Platelets 315 150 - 400 K/uL   nRBC 0.0 0.0 - 0.2 %  hCG, quantitative, pregnancy   Collection Time: 05/08/22 10:03 PM  Result Value Ref Range   hCG, Beta Chain, Quant, S 3,113 (H) <5 mIU/mL    Imaging Studies: US OB LESS THAN 14 WEEKS WITH OB TRANSVAGINAL  Result Date: 05/08/2022 CLINICAL DATA:  Cramping, VB today. EXAM: OBSTETRIC <14 WK [place] AND TRANSVAGINAL OB US TECHNIQUE: Both transabdominal and transvaginal ultrasound examinations were performed for complete evaluation of the gestation as well as the maternal uterus, adnexal regions, and pelvic cul-de-sac. Transvaginal technique was performed to assess early pregnancy. COMPARISON:  09/22/2019. FINDINGS: Uterus: Cystic area superior to the endometrium measuring 0.6 x 0.6 x 0.6 cm with no fetal pole or yolk sac, possible pseudogestational sac. Endometrial  thickness: 12 mm. An ectopic pregnancy is noted in the left adnexa lateral to the left ovary with gestational sac measuring 5.5 x 3.2 x 3.7 cm.  No yolk sac is seen. A fetal pole is present with crown-rump length at 14.55 mm, estimated gestational age [redacted] weeks 6 days. No fetal cardiac activity is seen. A thick walled septated cyst is noted in the left ovary measuring 2.3 x 1.9 cm, likely corpus luteal cyst. No abnormal vascularity is seen. The right ovary is within normal limits. No free fluid in the pelvis. IMPRESSION: 1. Left adnexal ectopic pregnancy with gestational sac containing a fetal pole, estimated gestational age of [redacted] weeks 6 days. No fetal cardiac activity is seen. No free fluid in the pelvis. OBGYN consultation is recommended. Critical findings were reported to nurse practitioner Judeth Horn at 10:35 p.m. 2. Anechoic structure in the uterus, possible pseudogestational sac. 3. Corpus luteal cyst in the left ovary. Electronically Signed   By: Thornell Sartorius M.D.   On: 05/08/2022 22:56     Assessment and Plan: 1. Left tubal pregnancy without intrauterine pregnancy   -Patient stable. Last ate at 9 pm. Admit to [facility] unit for observation until surgery in the morning.   Judeth Horn, NP 05/09/2022 12:05 AM  OB Attending  Pt seen and examined Pt is stable Pt last ate at 9 PM ( chicken necks and rice) Do not see the need to proceed to surgery at this time Will admit for NPO status and schedule surgery.  Laparoscopic removal of ectopic reviewed with pt R/B/Post op care reviewed Pt verbalized understanding and agrees to proceed.  OR has been notified and pt has been added to add on schedule  Nettie Elm, MD

## 2022-05-09 NOTE — Telephone Encounter (Signed)
-----   Message from Bernerd Limbo, [place] sent at 05/05/2022  9:33 PM EDT ----- Regarding: Pt Needs Ultrasound Please call to help pt get a dating ultrasound (she is also really wanting to know viability as she was using multiple drugs at the beginning of the pregnancy) and then set up for routine OB care. Thank you!

## 2022-05-09 NOTE — Plan of Care (Signed)
Patient discharged home with printed instructions. Jousha Schwandt L Sandeep Radell, RN  

## 2022-05-09 NOTE — Progress Notes (Signed)
Subjective: Patient reports no pain.    Objective: I have reviewed patient's vital signs, labs, and radiology results. Blood pressure 110/63, pulse 84, temperature 98.2 F (36.8 C), temperature source Oral, resp. rate 17, height 5\' 3"  (1.6 m), weight 102.6 kg, last menstrual period 02/23/2022, SpO2 100 %.  General: alert, cooperative, and no distress hCG, Beta Chain, Quant, S 3,113 (H) <5 mIU/mL    Assessment/Plan: Left tubal ectopic pregnancy, not ruptured, quant as above. We discussed surgical vs. Medical treatment as she is a MTX candidate and she requests medical management with outpatient f/u HCG at [facility]. The risks of methotrexate were reviewed including failure requiring repeat dosing or eventual surgery. She understands that methotrexate involves frequent return visits to monitor lab values and that she remains at risk of ectopic rupture until her beta is less than assay. ?The patient opts to proceed with methotrexate.  She has no history of hepatic or renal dysfunction, has normal BUN/Cr/LFT's/platelets.  She is felt to be reliable for follow-up. Side effects of photosensitivity & GI upset were discussed.  She knows to avoid direct sunlight and abstain from alcohol, NSAIDs and sexual intercourse for two weeks. She was counseled to discontinue any MVI with folic acid. ?She understands to follow up on D4 (9/1) and D7 (9/4) for repeat BHCG and was given the instruction sheet. ?Strict ectopic precautions were reviewed, the patient knows to call with any abdominal pain, vomiting, fainting, or any concerns with her health.  Day 0/1 Day 4 Day 7  Sunday Wednesday Saturday  Monday Thursday Sunday  Tuesday Friday Monday  Wednesday Saturday Tuesday  Thursday Sunday Wednesday  Friday Monday Thursday  Saturday Tuesday Friday     LOS: 0 days    Wednesday, MD 05/09/2022, 9:10 AM

## 2022-05-10 ENCOUNTER — Telehealth: Payer: Self-pay | Admitting: Family Medicine

## 2022-05-10 NOTE — Telephone Encounter (Signed)
Called pt back to explain reason for bloodwork needed on 9/1 and she did not answer. Per chart review of Dr. Olivia Mackie note, pt had MTX @ [facility] and needs stat BHCG on 9/1 (Breven Guidroz #4) as well as 9/4 (Travez Stancil #7). I left a message stating that I am returning her call. I asked that she call us back and leave a new message on nurse VM stating whether or not we can leave detailed information on her VM.

## 2022-05-10 NOTE — Telephone Encounter (Signed)
Patient want to know if she need to come back in for more blood work, she said the hospital told her but not a good reason of why

## 2022-05-11 ENCOUNTER — Ambulatory Visit: Payer: BC Managed Care – PPO

## 2022-05-11 ENCOUNTER — Other Ambulatory Visit: Payer: Self-pay

## 2022-05-11 ENCOUNTER — Encounter: Payer: Self-pay | Admitting: *Deleted

## 2022-05-11 ENCOUNTER — Other Ambulatory Visit: Payer: BC Managed Care – PPO

## 2022-05-11 NOTE — Progress Notes (Signed)
Kristen Good came to office to get stat bhcg today. After review of chart I explained to her that she is not due to get her stat bhcg today, that she actually should get it tomorrow, when it is day 4 after methotrexate. She states she thought surgeon told her Friday, but someone told her to come today. She states she can come tomorrow. I sent her to registrar to reschedule. She states she is having bleeding and passesd some tissue. No concerns voiced. Nancy Fetter

## 2022-05-11 NOTE — Telephone Encounter (Signed)
Patient came to office today and I explained she needs stat bhcg tomorrow 9/1 on day 4. She voices understanding. Nancy Fetter

## 2022-05-12 ENCOUNTER — Ambulatory Visit (INDEPENDENT_AMBULATORY_CARE_PROVIDER_SITE_OTHER): Payer: Self-pay | Admitting: General Practice

## 2022-05-12 VITALS — BP 132/86 | HR 122 | Ht 63.0 in | Wt 224.0 lb

## 2022-05-12 DIAGNOSIS — O00202 Left ovarian pregnancy without intrauterine pregnancy: Secondary | ICD-10-CM

## 2022-05-12 LAB — BETA HCG QUANT (REF LAB): hCG Quant: 1105 m[IU]/mL

## 2022-05-12 NOTE — Progress Notes (Signed)
Beta HCG Follow-up Visit  Cathy Crounse presents to [facility] for follow-up beta HCG lab. She was seen in [facility] for vaginal bleeding on 8/28. Patient reports bleeding similar to a period and mild cramping today. Discussed with patient that we are following beta HCG levels today. Results will be back in approximately 2 hours. Valid contact number for patient confirmed. I will call the patient with results.   Beta HCG results:          8/28        3,113           9/1        1,105      Results and patient history reviewed with Dr Shawnie Pons, who states bhcg levels are decreasing appropriately, patient should have follow up bhcg on Monday as previously discussed. Patient called and informed of plan for follow-up. Advised she return to [facility]U if she has severe pain that is not improving. Patient verbalized understanding.  Marylynn Pearson 05/12/2022 8:51 AM

## 2022-05-16 ENCOUNTER — Other Ambulatory Visit: Payer: BC Managed Care – PPO

## 2023-03-04 ENCOUNTER — Inpatient Hospital Stay (HOSPITAL_COMMUNITY)
Admission: AD | Admit: 2023-03-04 | Discharge: 2023-03-04 | Disposition: A | Discharge status: Home / Self Care | Payer: Medicaid Other | Attending: Obstetrics and Gynecology | Admitting: Obstetrics and Gynecology

## 2023-03-04 ENCOUNTER — Encounter (HOSPITAL_COMMUNITY): Payer: Self-pay | Admitting: *Deleted

## 2023-03-04 DIAGNOSIS — O99012 Anemia complicating pregnancy, second trimester: Secondary | ICD-10-CM | POA: Insufficient documentation

## 2023-03-04 DIAGNOSIS — O23592 Infection of other part of genital tract in pregnancy, second trimester: Secondary | ICD-10-CM | POA: Insufficient documentation

## 2023-03-04 DIAGNOSIS — Z3490 Encounter for supervision of normal pregnancy, unspecified, unspecified trimester: Secondary | ICD-10-CM

## 2023-03-04 DIAGNOSIS — O26812 Pregnancy related exhaustion and fatigue, second trimester: Secondary | ICD-10-CM

## 2023-03-04 DIAGNOSIS — Z3A22 22 weeks gestation of pregnancy: Secondary | ICD-10-CM | POA: Insufficient documentation

## 2023-03-04 DIAGNOSIS — B9689 Other specified bacterial agents as the cause of diseases classified elsewhere: Secondary | ICD-10-CM | POA: Diagnosis not present

## 2023-03-04 DIAGNOSIS — R109 Unspecified abdominal pain: Secondary | ICD-10-CM | POA: Diagnosis present

## 2023-03-04 LAB — HIV ANTIBODY (ROUTINE TESTING W REFLEX): HIV Screen 4th Generation wRfx: NONREACTIVE

## 2023-03-04 LAB — URINALYSIS, ROUTINE W REFLEX MICROSCOPIC
Glucose, UA: NEGATIVE mg/dL
Hgb urine dipstick: NEGATIVE
Ketones, ur: 5 mg/dL — AB
Nitrite: NEGATIVE
Protein, ur: 30 mg/dL — AB
Specific Gravity, Urine: 1.032 — ABNORMAL HIGH (ref 1.005–1.030)
pH: 5 (ref 5.0–8.0)

## 2023-03-04 LAB — CBC
HCT: 30.1 % — ABNORMAL LOW (ref 36.0–46.0)
Hemoglobin: 9.8 g/dL — ABNORMAL LOW (ref 12.0–15.0)
MCH: 27.3 pg (ref 26.0–34.0)
MCHC: 32.6 g/dL (ref 30.0–36.0)
MCV: 83.8 fL (ref 80.0–100.0)
Platelets: 262 10*3/uL (ref 150–400)
RBC: 3.59 MIL/uL — ABNORMAL LOW (ref 3.87–5.11)
RDW: 13.3 % (ref 11.5–15.5)
WBC: 8.6 10*3/uL (ref 4.0–10.5)
nRBC: 0 % (ref 0.0–0.2)

## 2023-03-04 LAB — WET PREP, GENITAL
Sperm: NONE SEEN
Trich, Wet Prep: NONE SEEN
WBC, Wet Prep HPF POC: >=10 — AB (ref <10)
Yeast Wet Prep HPF POC: NONE SEEN

## 2023-03-04 LAB — POCT PREGNANCY, URINE: Preg Test, Ur: POSITIVE — AB

## 2023-03-04 LAB — HEMOGLOBIN A1C
Hgb A1c MFr Bld: 5.5 % (ref 4.8–5.6)
Mean Plasma Glucose: 111.15 mg/dL

## 2023-03-04 LAB — HEPATITIS B SURFACE ANTIGEN: Hepatitis B Surface Ag: NONREACTIVE

## 2023-03-04 LAB — HEPATITIS C ANTIBODY: HCV Ab: NONREACTIVE

## 2023-03-04 MED ORDER — VITAMIN C 250 MG PO TABS: 250.0000 mg | ORAL_TABLET | ORAL | 4 refills | Status: DC

## 2023-03-04 MED ORDER — FERROUS SULFATE 325 (65 FE) MG PO TBEC: 325.0000 mg | DELAYED_RELEASE_TABLET | ORAL | 4 refills | Status: AC

## 2023-03-04 MED ORDER — METRONIDAZOLE 500 MG PO TABS: 500.0000 mg | ORAL_TABLET | Freq: Two times a day (BID) | ORAL | 0 refills | Status: DC

## 2023-03-04 MED ORDER — PREPLUS 27-1 MG PO TABS: 1.0000 | ORAL_TABLET | Freq: Every day | ORAL | 6 refills | Status: DC

## 2023-03-04 NOTE — MAU Note (Signed)
.  Kristen Good is a 37 y.o. at Unknown here in [facility]U reporting: pt has irregular periods.  Last one she remembers was in Jan. Had a positive pregnancy test last month. Tested negative a month before that. Pt c/o of fatigue and feeling dizzy sometimes. Also c/o abd pain and cramping. No abd pain today but c/o mild back pain. Denies any vag bleeding or discharge. Stated she thinks she may be anemic she has been in the past with pregnancy.  LMP: aprox Jan 15,2024 Onset of complaint:  1 month Pain score: 5 Vitals:   03/04/23 2010  BP: 125/73  Pulse: 97  Resp: 18  Temp: 97.9 F (36.6 C)     FHT:161 Lab orders placed from triage:  u/a

## 2023-03-04 NOTE — Discharge Instructions (Signed)

## 2023-03-04 NOTE — MAU Provider Note (Cosign Needed Addendum)
History     [id number]  Arrival date and time: 03/04/23 1943    Chief Complaint  Patient presents with   Dizziness   Abdominal Pain     HPI Kristen Good is a 37 y.o. [id number] at [redacted]w[redacted]d by unsure LMP who presents for abdominal cramping & dizziness. Reports she may have had a period in January. Had a positive pregnancy test last month.  Has been having issues with intermittent fatigue, dizziness, & abdominal cramping. Cramping worse when she repositioning herself. Denies headache, fever, n/v/d, constipation, dysuria, vaginal bleeding, vaginal discharge, or LOF. Has been feeling fetal movement.   OB History     Gravida  7   Para  2   Term  2   Preterm      AB  4   Living  2      SAB  3   IAB      Ectopic  1   Multiple  0   Live Births  2           Past Medical History:  Diagnosis Date   Anemia    Depression    Mental disorder    bipolar and depression    Past Surgical History:  Procedure Laterality Date   NO PAST SURGERIES      Family History  Problem Relation Age of Onset   Asthma Daughter    Asthma Half-Brother     Social History   Socioeconomic History   Marital status: Single    Spouse name: Not on file   Number of children: Not on file   Years of education: Not on file   Highest education level: Not on file  Occupational History   Not on file  Tobacco Use   Smoking status: Former    Packs/day: .25    Types: Cigarettes   Smokeless tobacco: Never   Tobacco comments:    smokes 3 cig.week  Vaping Use   Vaping Use: Never used  Substance and Sexual Activity   Alcohol use: Yes    Alcohol/week: 2.0 standard drinks of alcohol    Types: 2 Cans of beer per week    Comment: just occassions, pt denies current use   Drug use: No   Sexual activity: Yes    Partners: Male    Birth control/protection: None  Other Topics Concern   Not on file  Social History Narrative   Not on file   Social Determinants of Health   Financial  Resource Strain: Not on file  Food Insecurity: Not on file  Transportation Needs: Not on file  Physical Activity: Not on file  Stress: Not on file  Social Connections: Not on file  Intimate Partner Violence: Not At Risk (03/03/2020)   Humiliation, Afraid, Rape, and Kick questionnaire    Fear of Current or Ex-Partner: No    Emotionally Abused: No    Physically Abused: No    Sexually Abused: No    No Known Allergies  No current facility-administered medications on file prior to encounter.   No current outpatient medications on file prior to encounter.     ROS Pertinent positives and negative per HPI, all others reviewed and negative  Physical Exam   BP 115/76 (BP Location: Right Arm)   Pulse 98   Temp 97.9 F (36.6 C)   Resp 18   Ht 5\' 3"  (1.6 m)   Wt 103.9 kg   LMP 09/25/2022   SpO2 98%   BMI 40.57 kg/m  Patient Vitals for the past 24 hrs:  BP Temp Pulse Resp SpO2 Height Weight  03/04/23 2032 115/76 -- 98 18 98 % -- --  03/04/23 2010 125/73 97.9 F (36.6 C) 97 18 -- 5\' 3"  (1.6 m) 103.9 kg    Physical Exam Vitals and nursing note reviewed. Exam conducted with a chaperone present.  Constitutional:      General: She is not in acute distress.    Appearance: She is well-developed.  HENT:     Head: Normocephalic and atraumatic.  Pulmonary:     Effort: Pulmonary effort is normal. No respiratory distress.  Abdominal:     Palpations: Abdomen is soft.     Tenderness: There is no abdominal tenderness.     Comments: Gravid, fundus at umbilicus  Genitourinary:    Comments: Cervix closed/thick Skin:    General: Skin is warm and dry.  Neurological:     Mental Status: She is alert.       Bedside Ultrasound Pt informed that the ultrasound is considered a limited OB ultrasound and is not intended to be a complete ultrasound exam.  Patient also informed that the ultrasound is not being completed with the intent of assessing for fetal or placental anomalies or any  pelvic abnormalities.  Explained that the purpose of today's ultrasound is to assess for   dating .  Patient acknowledges the purpose of the exam and the limitations of the study.     My interpretation: active fetus in cephalic presentation. Subjectively normal AFV. [redacted]w[redacted]d by BPD & femur length.    Labs Results for orders placed or performed during the hospital encounter of 03/04/23 (from the past 24 hour(s))  Pregnancy, urine POC     Status: Abnormal   Collection Time: 03/04/23  7:56 PM  Result Value Ref Range   Preg Test, Ur POSITIVE (A) NEGATIVE  Urinalysis, Routine w reflex microscopic -Urine, Clean Catch     Status: Abnormal   Collection Time: 03/04/23  8:45 PM  Result Value Ref Range   Color, Urine AMBER (A) YELLOW   APPearance CLOUDY (A) CLEAR   Specific Gravity, Urine 1.032 (H) 1.005 - 1.030   pH 5.0 5.0 - 8.0   Glucose, UA NEGATIVE NEGATIVE mg/dL   Hgb urine dipstick NEGATIVE NEGATIVE   Bilirubin Urine SMALL (A) NEGATIVE   Ketones, ur 5 (A) NEGATIVE mg/dL   Protein, ur 30 (A) NEGATIVE mg/dL   Nitrite NEGATIVE NEGATIVE   Leukocytes,Ua SMALL (A) NEGATIVE   RBC / HPF 0-5 0 - 5 RBC/hpf   WBC, UA 0-5 0 - 5 WBC/hpf   Bacteria, UA MANY (A) NONE SEEN   Squamous Epithelial / HPF 21-50 0 - 5 /HPF   Mucus PRESENT    Ca Oxalate Crys, UA PRESENT   Wet prep, genital     Status: Abnormal   Collection Time: 03/04/23  8:56 PM  Result Value Ref Range   Yeast Wet Prep HPF POC NONE SEEN NONE SEEN   Trich, Wet Prep NONE SEEN NONE SEEN   Clue Cells Wet Prep HPF POC PRESENT (A) NONE SEEN   WBC, Wet Prep HPF POC >=10 (A) <10   Sperm NONE SEEN   CBC     Status: Abnormal   Collection Time: 03/04/23  9:09 PM  Result Value Ref Range   WBC 8.6 4.0 - 10.5 K/uL   RBC 3.59 (L) 3.87 - 5.11 MIL/uL   Hemoglobin 9.8 (L) 12.0 - 15.0 g/dL   HCT 96.2 (L) 95.2 -  46.0 %   MCV 83.8 80.0 - 100.0 fL   MCH 27.3 26.0 - 34.0 pg   MCHC 32.6 30.0 - 36.0 g/dL   RDW 16.1 09.6 - 04.5 %   Platelets 262 150 -  400 K/uL   nRBC 0.0 0.0 - 0.2 %  Hemoglobin A1c     Status: None   Collection Time: 03/04/23  9:09 PM  Result Value Ref Range   Hgb A1c MFr Bld 5.5 4.8 - 5.6 %   Mean Plasma Glucose 111.15 mg/dL    Imaging No results found.  MAU Course  Procedures Lab Orders         Wet prep, genital         Culture, OB Urine         Urinalysis, Routine w reflex microscopic -Urine, Clean Catch         CBC         RPR         HIV Antibody (routine testing w rflx)         Rubella screen         Hepatitis B surface antigen         Hemoglobin A1c         Hepatitis C antibody         Pregnancy, urine POC    Meds ordered this encounter  Medications   ferrous sulfate 325 (65 FE) MG EC tablet    Sig: Take 1 tablet (325 mg total) by mouth every other day.    Dispense:  30 tablet    Refill:  4    Order Specific Question:   Supervising Provider    Answer:   Reva Bores [[id number]]   vitamin C (ASCORBIC ACID) 250 MG tablet    Sig: Take 1 tablet (250 mg total) by mouth every other day. Take with iron pills    Dispense:  30 tablet    Refill:  4    Order Specific Question:   Supervising Provider    Answer:   Reva Bores [[id number]]   Prenatal Vit-Fe Fumarate-FA (PREPLUS) 27-1 MG TABS    Sig: Take 1 tablet by mouth daily.    Dispense:  30 tablet    Refill:  6    Order Specific Question:   Supervising Provider    Answer:   Reva Bores [[id number]]   Imaging Orders         [place] MFM OB DETAIL +14 WK      MDM Patient presents at ~[redacted]w[redacted]d by unsure LMP. FHT present via doppler by nurse. BSUS performed to ensure less than 23 weeks. [redacted]w[redacted]d by BPD & femur length. Will not adjust official EDD until formal ultrasound performed.   Cervix closed/thick. U/a, urine culture, wet prep, GC/CT, & prenatal labs ordered.  Will order outpatient anatomy scan & send message to start prenatal care  Care turned over to [facility] Judeth Horn, NP 03/04/2023 9:04 PM   Assessment and Plan  1. Fatigue during pregnancy  in second trimester - Due to mild anemia  2. Anemia affecting pregnancy in second trimester - Information provided on anemia in pregnancy - Rx: Ferrous Sulfate 325 mg po every other day - Rx: Vitamin C 500 mg po every other day with iron pills   3. Bacterial vaginosis - Information provided on BV - Rx: Flagyl 500 mg po BID x 7 days   4. [redacted] weeks gestation of pregnancy - Rx: Prenatal vitamins -  Prenatal labs pending  5. Intrauterine pregnancy - [place] MFM OB DETAIL +14 WK; Future  - Discharge home - Make appt to start [facility] with [facility] (message sent to Admin pool by Estanislado Spire, FNP) - Patient verbalized an understanding of the plan of care and agrees.   Raelyn Mora, CNM  03/04/2023 10:43 PM   Raelyn Mora, CNM  03/04/2023 10:35 PM

## 2023-03-05 LAB — GC/CHLAMYDIA PROBE AMP (~~LOC~~) NOT AT ARMC
Chlamydia: NEGATIVE
Comment: NEGATIVE
Comment: NORMAL
Neisseria Gonorrhea: NEGATIVE

## 2023-03-05 LAB — RPR: RPR Ser Ql: NONREACTIVE

## 2023-03-06 LAB — RUBELLA SCREEN: Rubella: <0.9 index — ABNORMAL LOW (ref >0.99)

## 2023-03-06 LAB — CULTURE, OB URINE

## 2023-03-26 ENCOUNTER — Other Ambulatory Visit: Payer: Self-pay

## 2023-03-26 ENCOUNTER — Encounter: Payer: Self-pay | Admitting: Obstetrics and Gynecology

## 2023-03-26 ENCOUNTER — Ambulatory Visit (INDEPENDENT_AMBULATORY_CARE_PROVIDER_SITE_OTHER): Payer: MEDICAID | Admitting: Obstetrics and Gynecology

## 2023-03-26 VITALS — BP 114/66 | HR 97 | Wt 231.5 lb

## 2023-03-26 DIAGNOSIS — O0932 Supervision of pregnancy with insufficient antenatal care, second trimester: Secondary | ICD-10-CM | POA: Diagnosis not present

## 2023-03-26 DIAGNOSIS — Z3A26 26 weeks gestation of pregnancy: Secondary | ICD-10-CM

## 2023-03-26 DIAGNOSIS — O0992 Supervision of high risk pregnancy, unspecified, second trimester: Secondary | ICD-10-CM | POA: Diagnosis not present

## 2023-03-26 DIAGNOSIS — O09292 Supervision of pregnancy with other poor reproductive or obstetric history, second trimester: Secondary | ICD-10-CM

## 2023-03-26 DIAGNOSIS — O09299 Supervision of pregnancy with other poor reproductive or obstetric history, unspecified trimester: Secondary | ICD-10-CM | POA: Insufficient documentation

## 2023-03-26 DIAGNOSIS — O0990 Supervision of high risk pregnancy, unspecified, unspecified trimester: Secondary | ICD-10-CM | POA: Insufficient documentation

## 2023-03-26 DIAGNOSIS — Z6839 Body mass index (BMI) 39.0-39.9, adult: Secondary | ICD-10-CM

## 2023-03-26 DIAGNOSIS — O0930 Supervision of pregnancy with insufficient antenatal care, unspecified trimester: Secondary | ICD-10-CM | POA: Insufficient documentation

## 2023-03-26 DIAGNOSIS — O09529 Supervision of elderly multigravida, unspecified trimester: Secondary | ICD-10-CM | POA: Insufficient documentation

## 2023-03-26 NOTE — Patient Instructions (Signed)
Anatomy Ultrasound appointment on Friday, July 19th at 8:15AM. It will take place at the [facility], but it will be on the second floor; so when you enter the building, just head straight to the elevator and go to the second floor and they will get you checked in up there. You are only allowed 1 extra person in the room with you, and no children. If you need to reschedule, the number is [phone].

## 2023-03-26 NOTE — Progress Notes (Signed)
INITIAL PRENATAL VISIT NOTE  Subjective:  Kristen Good is a 37 y.o. [id number] at [redacted]w[redacted]d by questionable LMP being seen today for her initial prenatal visit.She has an obstetric history significant for postpartum hemorrhage. She has a medical history significant for anemia and depression.  Patient reports no complaints.  Contractions: Not present. Vag. Bleeding: None.  Movement: Present. Denies leaking of fluid.    Past Medical History:  Diagnosis Date   Anemia    Depression    Mental disorder    bipolar and depression    Past Surgical History:  Procedure Laterality Date   NO PAST SURGERIES      OB History  Gravida Para Term Preterm AB Living  7 2 2   4 2   SAB IAB Ectopic Multiple Live Births  3   1 0 2    # Outcome Date GA Lbr Len/2nd Weight Sex Type Anes PTL Lv  7 Current           6 Ectopic 05/2022     ECTOPIC     5 SAB 09/2019          4 Term 06/04/15 [redacted]w[redacted]d  6 lb 11.2 oz (3.04 kg) F Vag-Spont None    3 Term 12/13/08 [redacted]w[redacted]d    Vag-Spont   LIV  2 SAB           1 SAB             Social History   Socioeconomic History   Marital status: Single    Spouse name: Not on file   Number of children: Not on file   Years of education: Not on file   Highest education level: Not on file  Occupational History   Not on file  Tobacco Use   Smoking status: Former    Current packs/day: 0.25    Types: Cigarettes   Smokeless tobacco: Never   Tobacco comments:    smokes 3 cig.week  Vaping Use   Vaping status: Never Used  Substance and Sexual Activity   Alcohol use: Yes    Alcohol/week: 2.0 standard drinks of alcohol    Types: 2 Cans of beer per week    Comment: just occassions, pt denies current use   Drug use: No   Sexual activity: Yes    Partners: Male    Birth control/protection: None  Other Topics Concern   Not on file  Social History Narrative   Not on file   Social Determinants of Health   Financial Resource Strain: Not on file  Food Insecurity: Not on file   Transportation Needs: Not on file  Physical Activity: Not on file  Stress: Not on file  Social Connections: Not on file    Family History  Problem Relation Age of Onset   Asthma Daughter    Asthma Half-Brother      Current Outpatient Medications:    ferrous sulfate 325 (65 FE) MG EC tablet, Take 1 tablet (325 mg total) by mouth every other day., Disp: 30 tablet, Rfl: 4   Prenatal Vit-Fe Fumarate-FA (PREPLUS) 27-1 MG TABS, Take 1 tablet by mouth daily., Disp: 30 tablet, Rfl: 6   vitamin C (ASCORBIC ACID) 250 MG tablet, Take 1 tablet (250 mg total) by mouth every other day. Take with iron pills, Disp: 30 tablet, Rfl: 4  No Known Allergies  Review of Systems: Negative except for what is mentioned in HPI.  Objective:   Vitals:   03/26/23 0931  BP: 114/66  Pulse:  97  Weight: 231 lb 8 oz (105 kg)    Fetal Status: Fetal Heart Rate (bpm): 156   Movement: Present     Physical Exam: BP 114/66   Pulse 97   Wt 231 lb 8 oz (105 kg)   LMP 09/25/2022   BMI 41.01 kg/m  CONSTITUTIONAL: Well-developed, obese, well-nourished female in no acute distress.  NEUROLOGIC: Alert and oriented to person, place, and time. Normal reflexes, muscle tone coordination. No cranial nerve deficit noted. PSYCHIATRIC: Normal mood and affect. Normal behavior. Normal judgment and thought content. SKIN: Skin is warm and dry. No rash noted. Not diaphoretic. No erythema. No pallor. HENT:  Normocephalic, atraumatic, External right and left ear normal. Oropharynx is clear and moist EYES: Conjunctivae and EOM are normal.  NECK: Normal range of motion, supple, no masses CARDIOVASCULAR: Normal heart rate noted, regular rhythm RESPIRATORY: Effort and breath sounds normal, no problems with respiration noted BREASTS: deferred ABDOMEN: Soft, nontender, nondistended, gravid. GU: deferred MUSCULOSKELETAL: Normal range of motion. EXT:  No edema and no tenderness. 2+ distal pulses.   Assessment and Plan:   Pregnancy: [id number] at [redacted]w[redacted]d by LMP  1. Supervision of high risk pregnancy, antepartum Pt has anatomy /dating scan on 7/19.  She is aware her due date may change - PANORAMA PRENATAL TEST - [facility] Basic Panel  2. BMI 39.0-39.9,adult   3. Late prenatal care affecting pregnancy in second trimester   4. History of postpartum hemorrhage, currently pregnant Tocolytics post delivery, consider prophylactic TXA   Preterm labor symptoms and general obstetric precautions including but not limited to vaginal bleeding, contractions, leaking of fluid and fetal movement were reviewed in detail with the patient.  Please refer to After Visit Summary for other counseling recommendations.   Return in about 3 weeks (around 04/16/2023) for ROB, in person, 2 hr GTT, 3rd trim labs.  Warden Fillers 03/26/2023 10:43 AM

## 2023-03-30 ENCOUNTER — Ambulatory Visit (HOSPITAL_BASED_OUTPATIENT_CLINIC_OR_DEPARTMENT_OTHER): Payer: MEDICAID | Admitting: Obstetrics

## 2023-03-30 ENCOUNTER — Encounter: Payer: Self-pay | Admitting: *Deleted

## 2023-03-30 ENCOUNTER — Ambulatory Visit: Payer: MEDICAID | Admitting: *Deleted

## 2023-03-30 ENCOUNTER — Other Ambulatory Visit: Payer: Self-pay | Admitting: *Deleted

## 2023-03-30 ENCOUNTER — Ambulatory Visit: Payer: MEDICAID | Attending: Obstetrics and Gynecology

## 2023-03-30 VITALS — BP 109/74 | HR 91

## 2023-03-30 DIAGNOSIS — O99212 Obesity complicating pregnancy, second trimester: Secondary | ICD-10-CM | POA: Diagnosis not present

## 2023-03-30 DIAGNOSIS — Z3490 Encounter for supervision of normal pregnancy, unspecified, unspecified trimester: Secondary | ICD-10-CM

## 2023-03-30 DIAGNOSIS — O0932 Supervision of pregnancy with insufficient antenatal care, second trimester: Secondary | ICD-10-CM | POA: Diagnosis not present

## 2023-03-30 DIAGNOSIS — O35FXX0 Maternal care for other (suspected) fetal abnormality and damage, fetal musculoskeletal anomalies of trunk, not applicable or unspecified: Secondary | ICD-10-CM | POA: Diagnosis present

## 2023-03-30 DIAGNOSIS — O0992 Supervision of high risk pregnancy, unspecified, second trimester: Secondary | ICD-10-CM | POA: Diagnosis not present

## 2023-03-30 DIAGNOSIS — Z3A23 23 weeks gestation of pregnancy: Secondary | ICD-10-CM | POA: Insufficient documentation

## 2023-03-30 DIAGNOSIS — Z363 Encounter for antenatal screening for malformations: Secondary | ICD-10-CM | POA: Insufficient documentation

## 2023-03-30 DIAGNOSIS — Z362 Encounter for other antenatal screening follow-up: Secondary | ICD-10-CM

## 2023-03-30 DIAGNOSIS — O09522 Supervision of elderly multigravida, second trimester: Secondary | ICD-10-CM | POA: Diagnosis not present

## 2023-03-30 DIAGNOSIS — O09892 Supervision of other high risk pregnancies, second trimester: Secondary | ICD-10-CM

## 2023-03-30 DIAGNOSIS — O0990 Supervision of high risk pregnancy, unspecified, unspecified trimester: Secondary | ICD-10-CM | POA: Insufficient documentation

## 2023-04-03 NOTE — Progress Notes (Signed)
MFM Note  Kristen Good was seen for a detailed fetal anatomy scan due to maternal obesity with a BMI of 38.   She denies any significant past medical history and denies any problems in her current pregnancy.  The patient reports cocaine and alcohol use early in her pregnancy.  The results of her cell free DNA test are currently pending.  She had this test drawn 4 days ago.  Based on the fetal biometry measurements obtained today, her EDC was changed to July 25, 2023, making her 23 weeks and 2 days pregnant today.    The patient reports that her prior [facility] of July 02, 2023 was based on an uncertain LMP.  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  The fetal intra-abdominal contents including the fetal bowel and most likely the fetal liver appear to be herniated outside of the fetal abdomen and enveloped by a membrane/sac.  The umbilical cord appears to be inserted into the sac.  Slight left axis deviation of the fetal heart was also noted on today's exam.  I believe that this left axis deviation is most likely due to the herniation process of the intra-abdominal contents pulling the fetal organs.  The patient was advised that an omphalocele may be surgically repaired after birth.  We will make arrangements for a pediatric consultation to discuss the repair of an omphalocele after birth.  The pediatric surgeon will also make recommendations regarding the most optimal hospital/institution for delivery.  As omphalocele's may be associated with increased risks of fetal chromosomal abnormalities, the patient was offered and declined an amniocentesis today for definitive diagnosis.  She will await the results of her cell free DNA test.  Due to a fetus with an omphalocele, we will continue to follow her with growth ultrasounds throughout her pregnancy.    Weekly fetal testing will be started at around 32 weeks.    Due to a fetus with an omphalocele, she was referred to [facility] pediatric  cardiology for fetal echocardiogram.  A follow-up exam was scheduled in 4 weeks.    The patient stated that all of her questions were answered today.  A total of 45 minutes was spent counseling and coordinating the care for this patient.  Greater than 50% of the time was spent in direct face-to-face contact.

## 2023-04-04 LAB — PANORAMA PRENATAL TEST FULL PANEL:PANORAMA TEST PLUS 5 ADDITIONAL MICRODELETIONS: FETAL FRACTION: 6.9

## 2023-04-06 ENCOUNTER — Encounter: Payer: Self-pay | Admitting: Obstetrics & Gynecology

## 2023-04-06 DIAGNOSIS — O35FXX0 Maternal care for other (suspected) fetal abnormality and damage, fetal musculoskeletal anomalies of trunk, not applicable or unspecified: Secondary | ICD-10-CM | POA: Insufficient documentation

## 2023-04-12 ENCOUNTER — Other Ambulatory Visit: Payer: Self-pay

## 2023-04-12 DIAGNOSIS — O0990 Supervision of high risk pregnancy, unspecified, unspecified trimester: Secondary | ICD-10-CM

## 2023-04-16 ENCOUNTER — Ambulatory Visit (INDEPENDENT_AMBULATORY_CARE_PROVIDER_SITE_OTHER): Payer: No Typology Code available for payment source | Admitting: Obstetrics & Gynecology

## 2023-04-16 ENCOUNTER — Other Ambulatory Visit: Payer: MEDICAID

## 2023-04-16 ENCOUNTER — Other Ambulatory Visit: Payer: Self-pay

## 2023-04-16 VITALS — BP 120/85 | HR 101 | Wt 234.0 lb

## 2023-04-16 DIAGNOSIS — O0990 Supervision of high risk pregnancy, unspecified, unspecified trimester: Secondary | ICD-10-CM

## 2023-04-16 DIAGNOSIS — Z23 Encounter for immunization: Secondary | ICD-10-CM

## 2023-04-16 DIAGNOSIS — O0993 Supervision of high risk pregnancy, unspecified, third trimester: Secondary | ICD-10-CM

## 2023-04-16 DIAGNOSIS — O35FXX0 Maternal care for other (suspected) fetal abnormality and damage, fetal musculoskeletal anomalies of trunk, not applicable or unspecified: Secondary | ICD-10-CM

## 2023-04-16 DIAGNOSIS — O09523 Supervision of elderly multigravida, third trimester: Secondary | ICD-10-CM

## 2023-04-16 DIAGNOSIS — Z3A29 29 weeks gestation of pregnancy: Secondary | ICD-10-CM

## 2023-04-16 NOTE — Progress Notes (Signed)
PRENATAL VISIT NOTE  Subjective:  Kristen Good is a 37 y.o. [id number] at [redacted]w[redacted]d being seen today for ongoing prenatal care.  She is currently monitored for the following issues for this high-risk pregnancy and has BMI 39.0-39.9,adult; Ectopic pregnancy; Supervision of high risk pregnancy, antepartum; Late prenatal care affecting pregnancy; History of postpartum hemorrhage, currently pregnant; Advanced maternal age in multigravida; and Omphalocele of fetus on their problem list.  Patient reports no complaints. Dealing with the diagnosis of fetal omphalocele, has a lot of support. Contractions: Not present. Vag. Bleeding: None.  Movement: Present. Denies leaking of fluid.   The following portions of the patient's history were reviewed and updated as appropriate: allergies, current medications, past family history, past medical history, past social history, past surgical history and problem list.   Objective:   Vitals:   04/16/23 0829  BP: 120/85  Pulse: (!) 101  Weight: 234 lb (106.1 kg)    Fetal Status: Fetal Heart Rate (bpm): 150   Movement: Present     General:  Alert, oriented and cooperative. Patient is in no acute distress.  Skin: Skin is warm and dry. No rash noted.   Cardiovascular: Normal heart rate noted  Respiratory: Normal respiratory effort, no problems with respiration noted  Abdomen: Soft, gravid, appropriate for gestational age.  Pain/Pressure: Absent     Pelvic: Cervical exam deferred        Extremities: Normal range of motion.     Mental Status: Normal mood and affect. Normal behavior. Normal judgment and thought content.   Imaging: [place] MFM OB DETAIL +14 WK  Result Date: 04/03/2023 ----------------------------------------------------------------------  OBSTETRICS REPORT                        (Signed Final 04/03/2023 11:21 pm) ---------------------------------------------------------------------- Patient Info  ID #:       [id number]                          D.O.B.:   [birth year] (37 yrs)  Name:       Kristen Good                  Visit Date: 03/30/2023 08:59 am ---------------------------------------------------------------------- Performed By  Attending:        Ma Rings MD         Ref. Address:      [address]                                                              [place], [place]                                                              [address]  Performed By:     Alain Marion     [facility]  Referred By:      [facility] ---------------------------------------------------------------------- Orders  #  Description                           Code        Ordered By  1  [place] MFM OB DETAIL +14 WK               [id number]    Raelyn Mora ----------------------------------------------------------------------  #  Order #                     Accession #                Episode #  1  [id number]                   [id number]                 [id number] ---------------------------------------------------------------------- Indications  Omphalocele                                     O35.8XX0  Obesity complicating pregnancy, second          O99.212  trimester (BMI 38)  Late prenatal care, second trimester            O09.32  [redacted] weeks gestation of pregnancy                 Z3A.23  Encounter for antenatal screening for           Z36.3  malformations ---------------------------------------------------------------------- Fetal Evaluation  Num Of Fetuses:          1  Fetal Heart Rate(bpm):   150  Cardiac Activity:        Observed  Presentation:            Cephalic  Placenta:                Posterior  P. Cord Insertion:       Visualized  Amniotic Fluid  AFI FV:      Within normal limits                               Largest Pocket(cm)                              6.16 ---------------------------------------------------------------------- Biometry  BPD:      58.1  mm     G. Age:  23w 6d         65  %    CI:        78.65   %    70 - 86                                                          FL/HC:       20.5  %    19.2 - 20.8  HC:      207.2  mm     G. Age:  22w 6d         18  %    HC/AC:       1.19       1.05 - 1.21  AC:      174.2  mm     G. Age:  22w 2d         16  %    FL/BPD:      73.0  %    71 - 87  FL:       42.4  mm     G. Age:  23w 6d         57  %    FL/AC:       24.3  %    20 - 24  HUM:      36.9  mm     G. Age:  22w 6d         34  %  CER:        27  mm     G. Age:  24w 1d         91  %  NFT:       3.6  mm  LV:        4.5  mm  CM:        6.9  mm  Est. FW:     556   gm     1 lb 4 oz     30  % ---------------------------------------------------------------------- OB History  Gravidity:    7  Living:       2 ---------------------------------------------------------------------- Gestational Age  LMP:           26w 4d        Date:  09/25/22                  EDD:   07/02/23  Clinical EDD:  23w 2d                                        EDD:   07/25/23  U/S Today:     23w 2d                                        EDD:   07/25/23  Best:          23w 2d     Det. By:  U/S (03/30/23)           EDD:   07/25/23 ---------------------------------------------------------------------- Anatomy  Cranium:  Appears normal         Aortic Arch:            Not well visualized  Cavum:                 Not well visualized    Ductal Arch:            Not well visualized  Ventricles:            Appears normal         Diaphragm:              Appears normal  Choroid Plexus:        Appears normal         Stomach:                Appears normal, left                                                                        sided  Cerebellum:            Appears normal         Abdomen:                Omphalocele  Posterior Fossa:       Appears  normal         Abdominal Wall:         Omphalocele  Nuchal Fold:           Appears normal         Cord Vessels:           Appears normal (3                                                                        vessel cord)  Face:                  Appears normal         Kidneys:                Appear normal                         (orbits and profile)  Lips:                  Appears normal         Bladder:                Appears normal  Thoracic:              Appears normal         Spine:                  Limited views  appear normal  Heart:                 Abnormal, see          Upper Extremities:      Not well visualized                         comments  RVOT:                  Not well visualized    Lower Extremities:      Appears normal  LVOT:                  Not well visualized  Other:  Heels/feet, nasal bone, lenses, mandible, falx visualized. Technically          difficult due to fetal position. ---------------------------------------------------------------------- Cervix Uterus Adnexa  Cervix  Normal appearance by transabdominal scan  Uterus  No abnormality visualized.  Right Ovary  Not visualized.  Left Ovary  Not visualized.  Cul De Sac  No free fluid seen.  Adnexa  No abnormality visualized ---------------------------------------------------------------------- Comments  Kristen Good was seen for a detailed fetal anatomy scan  due to maternal obesity with a BMI of 38.  She denies any significant past medical history and denies  any problems in her current pregnancy.  The patient reports  cocaine and alcohol use early in her pregnancy.  The results of her cell free DNA test are currently pending.  She had this test drawn 4 days ago.  Based on the fetal biometry measurements obtained today,  her EDC was changed to July 25, 2023, making her 23  weeks and 2 days pregnant today.  The patient reports that her prior [facility] of July 02, 2023  was based on an uncertain LMP.  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  The fetal intra-  abdominal contents including the fetal bowel and most likely  the fetal liver appear to be herniated outside of the fetal  abdomen and enveloped by a membrane/sac.  The umbilical  cord appears to be inserted into the sac.  Slight left axis deviation of the fetal heart was also noted on  today's exam.  I believe that this left axis deviation is most  likely due to the herniation process of the intra-abdominal  contents pulling the fetal organs.  The patient was advised that an omphalocele may be  surgically repaired after birth.  We will make arrangements for  a pediatric consultation to discuss the repair of an  omphalocele after birth.  The pediatric surgeon will also make  recommendations regarding the most optimal  hospital/institution for delivery.  As omphalocele's may be associated with increased risks of  fetal chromosomal abnormalities, the patient was offered and  declined an amniocentesis today for definitive diagnosis.  She will await the results of her cell free DNA test.  Due to a fetus with an omphalocele, we will continue to follow  her with growth ultrasounds throughout her pregnancy.  Weekly fetal testing will be started at around 32 weeks.  Due to a fetus with an omphalocele, she was referred to  [facility] pediatric cardiology for fetal echocardiogram.  A follow-up exam was scheduled in 4 weeks.  The patient stated that all of her questions were answered  today.  A total of 45 minutes was spent counseling and coordinating  the care for this patient.  Greater than 50% of the time was  spent  in direct face-to-face contact. ----------------------------------------------------------------------                   Ma Rings, MD Electronically Signed Final Report   04/03/2023 11:21 pm ----------------------------------------------------------------------    Assessment and Plan:  Pregnancy: [id number] at  [redacted]w[redacted]d 1. Omphalocele of fetus Followed by MFM and [facility], they will determine location for delviery. Fetal ECHO ordered given increased risk of cardiac malformation. Offered to talk to [facility]), she declined. - US Fetal Echocardiography; Future  2. Multigravida of advanced maternal age in third trimester 3. [redacted] weeks gestation of pregnancy 4. Supervision of high risk pregnancy, antepartum Third trimester labs done today, will follow up results and manage accordingly. Tdap administered.  - Tdap vaccine greater than or equal to 7yo IM Preterm labor symptoms and general obstetric precautions including but not limited to vaginal bleeding, contractions, leaking of fluid and fetal movement were reviewed in detail with the patient. Please refer to After Visit Summary for other counseling recommendations.   Return in about 2 weeks (around 04/30/2023).  Future Appointments  Date Time Provider Department Center  04/30/2023 11:15 AM [facility] NURSE [facility]  04/30/2023 11:30 AM [facility] US3 [facility]  05/08/2023 10:15 AM Adam Phenix, MD [facility]    Jaynie Collins, MD

## 2023-04-30 ENCOUNTER — Other Ambulatory Visit: Payer: Self-pay | Admitting: *Deleted

## 2023-04-30 ENCOUNTER — Ambulatory Visit: Payer: MEDICAID | Attending: Obstetrics

## 2023-04-30 ENCOUNTER — Other Ambulatory Visit: Payer: MEDICAID

## 2023-04-30 ENCOUNTER — Ambulatory Visit: Payer: MEDICAID | Admitting: *Deleted

## 2023-04-30 VITALS — BP 113/62 | HR 103

## 2023-04-30 DIAGNOSIS — O0932 Supervision of pregnancy with insufficient antenatal care, second trimester: Secondary | ICD-10-CM

## 2023-04-30 DIAGNOSIS — Z3A27 27 weeks gestation of pregnancy: Secondary | ICD-10-CM

## 2023-04-30 DIAGNOSIS — O35FXX0 Maternal care for other (suspected) fetal abnormality and damage, fetal musculoskeletal anomalies of trunk, not applicable or unspecified: Secondary | ICD-10-CM

## 2023-04-30 DIAGNOSIS — O358XX0 Maternal care for other (suspected) fetal abnormality and damage, not applicable or unspecified: Secondary | ICD-10-CM | POA: Diagnosis not present

## 2023-04-30 DIAGNOSIS — O0990 Supervision of high risk pregnancy, unspecified, unspecified trimester: Secondary | ICD-10-CM

## 2023-04-30 DIAGNOSIS — E669 Obesity, unspecified: Secondary | ICD-10-CM

## 2023-04-30 DIAGNOSIS — O09292 Supervision of pregnancy with other poor reproductive or obstetric history, second trimester: Secondary | ICD-10-CM | POA: Diagnosis not present

## 2023-04-30 DIAGNOSIS — Z362 Encounter for other antenatal screening follow-up: Secondary | ICD-10-CM | POA: Insufficient documentation

## 2023-04-30 DIAGNOSIS — O09522 Supervision of elderly multigravida, second trimester: Secondary | ICD-10-CM

## 2023-04-30 DIAGNOSIS — O99212 Obesity complicating pregnancy, second trimester: Secondary | ICD-10-CM

## 2023-04-30 DIAGNOSIS — O09892 Supervision of other high risk pregnancies, second trimester: Secondary | ICD-10-CM | POA: Diagnosis not present

## 2023-05-03 ENCOUNTER — Other Ambulatory Visit: Payer: Self-pay | Admitting: *Deleted

## 2023-05-03 ENCOUNTER — Other Ambulatory Visit: Payer: Self-pay | Admitting: Obstetrics and Gynecology

## 2023-05-03 ENCOUNTER — Ambulatory Visit: Payer: No Typology Code available for payment source | Attending: Obstetrics and Gynecology

## 2023-05-03 ENCOUNTER — Ambulatory Visit (HOSPITAL_BASED_OUTPATIENT_CLINIC_OR_DEPARTMENT_OTHER): Payer: No Typology Code available for payment source | Admitting: *Deleted

## 2023-05-03 ENCOUNTER — Ambulatory Visit: Payer: No Typology Code available for payment source

## 2023-05-03 ENCOUNTER — Ambulatory Visit (HOSPITAL_BASED_OUTPATIENT_CLINIC_OR_DEPARTMENT_OTHER): Payer: No Typology Code available for payment source

## 2023-05-03 ENCOUNTER — Ambulatory Visit: Payer: No Typology Code available for payment source | Admitting: *Deleted

## 2023-05-03 VITALS — BP 111/65 | HR 105

## 2023-05-03 DIAGNOSIS — O09293 Supervision of pregnancy with other poor reproductive or obstetric history, third trimester: Secondary | ICD-10-CM | POA: Insufficient documentation

## 2023-05-03 DIAGNOSIS — O99213 Obesity complicating pregnancy, third trimester: Secondary | ICD-10-CM | POA: Insufficient documentation

## 2023-05-03 DIAGNOSIS — O35FXX0 Maternal care for other (suspected) fetal abnormality and damage, fetal musculoskeletal anomalies of trunk, not applicable or unspecified: Secondary | ICD-10-CM | POA: Diagnosis not present

## 2023-05-03 DIAGNOSIS — O09523 Supervision of elderly multigravida, third trimester: Secondary | ICD-10-CM

## 2023-05-03 DIAGNOSIS — O09213 Supervision of pregnancy with history of pre-term labor, third trimester: Secondary | ICD-10-CM | POA: Insufficient documentation

## 2023-05-03 DIAGNOSIS — Q792 Exomphalos: Secondary | ICD-10-CM | POA: Insufficient documentation

## 2023-05-03 DIAGNOSIS — O368930 Maternal care for other specified fetal problems, third trimester, not applicable or unspecified: Secondary | ICD-10-CM | POA: Diagnosis not present

## 2023-05-03 DIAGNOSIS — Z3A28 28 weeks gestation of pregnancy: Secondary | ICD-10-CM | POA: Insufficient documentation

## 2023-05-03 DIAGNOSIS — O0933 Supervision of pregnancy with insufficient antenatal care, third trimester: Secondary | ICD-10-CM

## 2023-05-03 DIAGNOSIS — O0990 Supervision of high risk pregnancy, unspecified, unspecified trimester: Secondary | ICD-10-CM

## 2023-05-03 DIAGNOSIS — E669 Obesity, unspecified: Secondary | ICD-10-CM

## 2023-05-03 NOTE — Addendum Note (Signed)
Addended by: Carolanne Grumbling on: 05/03/2023 01:57 PM   Modules accepted: Orders

## 2023-05-03 NOTE — Addendum Note (Signed)
Addended by: Carolanne Grumbling on: 05/03/2023 02:03 PM   Modules accepted: Orders

## 2023-05-03 NOTE — Procedures (Signed)
Kristen Good 1986-07-30 [redacted]w[redacted]d  Fetus A Non-Stress Test Interpretation for 05/03/23  Indication: Advanced Maternal Age >40 years and morbidly obese, post amnio  Fetal Heart Rate A Mode: External Baseline Rate (A): 140 bpm Variability: Moderate Accelerations: 15 x 15 Decelerations: None Multiple birth?: No  Uterine Activity Mode: Toco Contraction Frequency (min): none Resting Tone Palpated: Relaxed  Interpretation (Fetal Testing) Nonstress Test Interpretation: Reactive Comments: Tracing reviewed by Dr. Judeth Cornfield

## 2023-05-07 ENCOUNTER — Ambulatory Visit: Payer: MEDICAID

## 2023-05-08 ENCOUNTER — Other Ambulatory Visit: Payer: Self-pay

## 2023-05-08 ENCOUNTER — Other Ambulatory Visit (HOSPITAL_COMMUNITY)
Admission: RE | Admit: 2023-05-08 | Discharge: 2023-05-08 | Disposition: A | Discharge status: Home / Self Care | Payer: No Typology Code available for payment source | Source: Ambulatory Visit | Attending: Obstetrics & Gynecology | Admitting: Obstetrics & Gynecology

## 2023-05-08 ENCOUNTER — Ambulatory Visit (INDEPENDENT_AMBULATORY_CARE_PROVIDER_SITE_OTHER): Payer: MEDICAID | Admitting: Obstetrics & Gynecology

## 2023-05-08 VITALS — BP 110/61 | HR 97 | Wt 237.7 lb

## 2023-05-08 DIAGNOSIS — B9689 Other specified bacterial agents as the cause of diseases classified elsewhere: Secondary | ICD-10-CM | POA: Insufficient documentation

## 2023-05-08 DIAGNOSIS — O0990 Supervision of high risk pregnancy, unspecified, unspecified trimester: Secondary | ICD-10-CM

## 2023-05-08 DIAGNOSIS — O0993 Supervision of high risk pregnancy, unspecified, third trimester: Secondary | ICD-10-CM | POA: Diagnosis not present

## 2023-05-08 DIAGNOSIS — Z3A28 28 weeks gestation of pregnancy: Secondary | ICD-10-CM | POA: Diagnosis not present

## 2023-05-08 DIAGNOSIS — N760 Acute vaginitis: Secondary | ICD-10-CM | POA: Diagnosis present

## 2023-05-08 DIAGNOSIS — O35FXX0 Maternal care for other (suspected) fetal abnormality and damage, fetal musculoskeletal anomalies of trunk, not applicable or unspecified: Secondary | ICD-10-CM

## 2023-05-08 DIAGNOSIS — O23593 Infection of other part of genital tract in pregnancy, third trimester: Secondary | ICD-10-CM | POA: Insufficient documentation

## 2023-05-08 NOTE — Progress Notes (Cosign Needed)
[facility] at [facility] for [facility] [address], [address] Phone:  [phone]   Fax:  [phone]    Name: Kristen Good Indication: Omphalocele noted on fetal ultrasound.  DOB: [birth year] Age: 37 y.o.   EDD: 07/25/2023 LMP: 09/25/2022 Referring Provider:  Raelyn Mora, CNM   EGA: [redacted]w[redacted]d  Genetic Counselor: Sheppard Plumber, MS, GC  OB Hx: [id number] Date of Appointment: 05/03/2023  Accompanied by: None. Face to Face Time: 60 Minutes   Previous Testing Completed:  [name] previously completed cell-free DNA screening (cfDNA) in this pregnancy. The result is low risk, consistent with a female fetus. This screening significantly reduces but does not eliminate the chance that the current pregnancy has Down syndrome (trisomy 97), trisomy 32, trisomy 77, and common sex chromosome conditions. Please see report for details. Additionally, there are many genetic conditions that cannot be detected by cfDNA.  [name] previously completed carrier screening. She screened to not be a carrier for cystic fibrosis (CF), spinal muscular atrophy (SMA), alpha thalassemia, and beta hemoglobinopathies. Please see report for details. A negative result on carrier screening reduces but does not eliminate the chance of being a carrier.    Pregnancy History:   This is [name]'s seventh pregnancy. She has two living children. She has had three early losses and one ectopic pregnancy. Reports she takes trazodone. Personal history of mental health concerns. Denies personal history of diabetes, high blood pressure, thyroid conditions, and seizures. Denies bleeding, infections, and fevers in this pregnancy. [name] reported drug use including cocaine, marijuana, and Percocet during this pregnancy. She reports weaning off when she found out she was pregnant, around 12/2022. She reports drinking and smoking cigarettes early in the pregnancy. We discussed the effects of tobacco,  drug, and alcohol use during pregnancy. Using cocaine during pregnancy can cause placental abruption, preterm birth, low birthweight, pregnancy loss, and neonatal abstinence syndrome (NAS). Cocaine use during pregnancy may also cause learning delays in the child. Use of opioids during pregnancy can result in complications such as congenital heart defects, gastroschesis, glaucoma, neural tube defects, miscarriage, stillbirth, neonatal abstinence syndrome (NAS), placental abruption, preeclampsia, preterm labor, and sudden infant death syndrome (SIDS). Alcohol use during pregnancy may cause fetal alcohol syndrome (FAS) in the child. Features common in FAS include intellectual disability, microcephaly, hypotonia, infant irritability, facial anomalies (thin upper vermilion of the mouth, cleft lip/palate, prominent ears), and atrial septal defects. The effects of marijuana on pregnancies are still largely unknown. While most studies have been reassuring regarding birth defects, others have demonstrated that there may be an increased chance for pregnancy complications, problems with placental function, and temporary postnatal withdrawal symptoms in children exposed to marijuana prenatally. Using tobacco during pregnancy has risks such as a reduction in birth weight, premature rupture of the membranes, premature birth, and pregnancy loss. There is also an increased risk of infantile complications such as sudden infant death syndrome (SIDS), colic, asthma, and childhood obesity. We discussed that we cannot determine the effects of these substances on the fetus prenatally, and reducing/stopping use is recommended.     Family History: A three-generation pedigree was created and scanned into Epic under the Media tab.  [name] has limited family history. She reports her mother may have lung cancer. Her maternal uncle's son died at 23 yo from unknown causes; he may have had intellectual disability; his brother (32s yo)  was born premature, has a shunt, and has developmental delays. [name] maternal aunt  has intellectual disability and lived in a group home. [name] is not aware of any diagnoses or genetic test reports.  [name] reproductive partner, [name], has limited family history. His father died from an unknown cancer, possibly prostate.  Maternal ethnicity reported as Black and paternal ethnicity reported as Black. Denies Ashkenazi Jewish ancestry. Family history not remarkable for consanguinity, individuals with birth defects, autism spectrum disorder, multiple spontaneous abortions, still births, or unexplained neonatal death. We discussed that conditions, such as mental health conditions and intellectual disability, are typically multifactorial in nature. Therefore, these conditions are typically affected by genetics and environmental factors and are usually not due to a single underlying genetic cause. Without a known genetic cause to the affected family members, it is difficult to assess the risks for the fetus and other family members. If [name] learns of any new information, we can revisit the recurrence risks.     Genetic Counseling:   Omphalocele detected on fetal ultrasound. [name] anatomy ultrasound on 03/30/2023 noted an omphalocele that included the fetal liver. Please see ultrasound report for details. An omphalocele is a herniation or pouch from the abdominal wall that contains abdominal contents, usually part of the small or large intestine, and possibly other organs. This structure also involves the umbilical cord. We discussed that infants with an omphalocele have an increased risk for other congenital abnormalities. Approximately 30-50% of babies with an omphalocele will have other birth defects, with structural differences of the heart, urinary tract, gastrointestinal tract, cleft lip and/or palate and neural tube defects being the most common. Omphalocele may occur as an  isolated birth defect or are part of numerous genetic conditions. Specifically, there is an increased risk for chromosome abnormalities of approximately 30-40%, most often trisomy 4 or trisomy 52, which are associated with a very poor prognosis. In the setting of typical chromosomes, omphalocele may be a feature of another genetic syndrome, caused by genetic changes within a single gene or deletion, duplication, or methylation of a series of contiguous genes. One example of a genetic syndrome with omphalocele as a major presenting feature is [place] Wiedemann syndrome. It is present in approximately 25% of all children with an omphalocele. Beckwith-Wiedemann syndrome (BWS) is a growth disorder characterized by macrosomia, macroglossia, visceromegaly, embryonal tumors, omphalocele, neonatal hypoglycemia, dysmorphic features, and renal abnormalities, as well as developmental delays and other features. Prenatal diagnosis for BWS and other genetic syndromes may be possible through amniocentesis. This procedure involves the removal of a sample of the amniotic fluid surrounding the fetus with ultrasound-guided use of a thin needle inserted through the maternal abdomen and uterus. This procedure is generally performed after the 15th week of pregnancy. Depending on the indication, the sample can be used for genetic tests including karyotype, chromosomal microarray, FISH analysis, single gene and multi-gene panels, and exome/genome sequencing as well as testing for open neural tube defects. Fetal cells from amniotic fluid are directly evaluated and > 99.5% of chromosome problems and > 98% of open neural tube defects can be detected. Possible procedural difficulties and complications that can arise include maternal fever/infection, cramping, bleeding, fluid leakage, and/or preterm delivery. The risk for preterm delivery with amniocentesis is 1/500. Ms. Taitiana elected amniocentesis.    We discussed that if no other  congenital abnormalities are found, the majority of infants with an omphalocele will undergo surgery shortly after birth to repair the abdominal wall defect. Studies have shown that their development tends to be relatively normal and, if isolated, this condition  is not necessarily associated with intellectual disabilities or other physical disabilities.   Moreover, the chance of chromosome differences in a fetus increases with advanced maternal age.   Birth Defects. All babies have approximately a 3-5% risk for a birth defect and a majority of these defects cannot be detected through the screening or diagnostic testing listed. Ultrasound may detect some birth defects, but it may not detect all birth defects. About half of pregnancies with Down syndrome do not show any soft markers on ultrasound. A normal ultrasound does not guarantee a healthy pregnancy.  Newborn Screening. The [place] Newborn Screening (NBS) program will screen all newborn babies for cystic fibrosis, spinal muscular atrophy, hemoglobinopathies, and numerous other conditions.    Patient Plan:  Proceed with: Amniocentesis performed 05/03/2023 for karyotype and chromosomal microarray. Depending on the results, Beckwith-Wiedemann syndrome testing may be ordered.   Ms. Khadeja has an upcoming appointment with the [facility] pediatric surgery team on 9/3.   Informed consent was obtained. All questions were answered.   Thank you for sharing in the care of Ms. Wilsie with [place].  Please do not hesitate to contact us at [phone] if you have any questions.  Sheppard Plumber, MS, GC Genetic Counselor  Genetic counseling student involved in appointment: No.

## 2023-05-08 NOTE — Progress Notes (Signed)
   PRENATAL VISIT NOTE  Subjective:  Kristen Good is a 37 y.o. [id number] at [redacted]w[redacted]d being seen today for ongoing prenatal care.  She is currently monitored for the following issues for this high-risk pregnancy and has BMI 39.0-39.9,adult; Supervision of high risk pregnancy, antepartum; Late prenatal care affecting pregnancy; History of postpartum hemorrhage, currently pregnant; Advanced maternal age in multigravida; and Omphalocele of fetus on their problem list.  Patient reports no complaints.  Contractions: Not present. Vag. Bleeding: None.  Movement: Present. Denies leaking of fluid.   The following portions of the patient's history were reviewed and updated as appropriate: allergies, current medications, past family history, past medical history, past social history, past surgical history and problem list.   Objective:   Vitals:   05/08/23 1023  BP: 110/61  Pulse: 97  Weight: 237 lb 11.2 oz (107.8 kg)    Fetal Status: Fetal Heart Rate (bpm): 150   Movement: Present     General:  Alert, oriented and cooperative. Patient is in no acute distress.  Skin: Skin is warm and dry. No rash noted.   Cardiovascular: Normal heart rate noted  Respiratory: Normal respiratory effort, no problems with respiration noted  Abdomen: Soft, gravid, appropriate for gestational age.  Pain/Pressure: Absent     Pelvic: Cervical exam deferred        Extremities: Normal range of motion.  Edema: None  Mental Status: Normal mood and affect. Normal behavior. Normal judgment and thought content.   Assessment and Plan:  Pregnancy: [id number] at [redacted]w[redacted]d 1. Acute vaginitis Vaginal discharge - Cervicovaginal ancillary only( [place])  2. Supervision of high risk pregnancy, antepartum Needs T&S  3. Omphalocele of fetus Has f/u with [facility] for elective CS and for peds surgery  Preterm labor symptoms and general obstetric precautions including but not limited to vaginal bleeding, contractions, leaking of fluid and  fetal movement were reviewed in detail with the patient. Please refer to After Visit Summary for other counseling recommendations.   Return in about 3 weeks (around 05/29/2023).  Future Appointments  Date Time Provider Department Center  05/17/2023  1:30 PM [facility] NURSE [facility]  05/17/2023  1:45 PM [facility] US6 [facility]  05/24/2023  1:15 PM Adam Phenix, MD [facility]  05/28/2023  2:15 PM [facility] NURSE [facility]  05/28/2023  2:30 PM [facility] US3 [facility]  06/04/2023  1:30 PM [facility] US5 [facility]  06/11/2023  1:30 PM [facility] US1 [facility]    Scheryl Darter, MD

## 2023-05-09 LAB — CERVICOVAGINAL ANCILLARY ONLY
Bacterial Vaginitis (gardnerella): POSITIVE — AB
Candida Glabrata: NEGATIVE
Candida Vaginitis: POSITIVE — AB
Chlamydia: NEGATIVE
Comment: NEGATIVE
Comment: NEGATIVE
Comment: NEGATIVE
Comment: NEGATIVE
Comment: NEGATIVE
Comment: NORMAL
Neisseria Gonorrhea: NEGATIVE
Trichomonas: NEGATIVE

## 2023-05-09 LAB — ABO AND RH: Rh Factor: POSITIVE

## 2023-05-16 LAB — DIRECT PRENATAL SNP CMA

## 2023-05-16 LAB — CHROMOSOME, AMNIOTIC FLUID
Cells Analyzed: 15
Cells Counted: 15
Cells Karyotyped: 2
Colonies: 15
GTG Band Resolution Achieved: 450

## 2023-05-16 LAB — MCC TRACKING

## 2023-05-17 ENCOUNTER — Ambulatory Visit: Payer: No Typology Code available for payment source | Admitting: *Deleted

## 2023-05-17 ENCOUNTER — Ambulatory Visit: Payer: Self-pay | Admitting: Obstetrics and Gynecology

## 2023-05-17 ENCOUNTER — Telehealth: Payer: Self-pay

## 2023-05-17 ENCOUNTER — Ambulatory Visit: Payer: No Typology Code available for payment source | Attending: Obstetrics and Gynecology

## 2023-05-17 VITALS — BP 111/63 | HR 95

## 2023-05-17 DIAGNOSIS — O35FXX0 Maternal care for other (suspected) fetal abnormality and damage, fetal musculoskeletal anomalies of trunk, not applicable or unspecified: Secondary | ICD-10-CM | POA: Diagnosis present

## 2023-05-17 DIAGNOSIS — O99213 Obesity complicating pregnancy, third trimester: Secondary | ICD-10-CM

## 2023-05-17 DIAGNOSIS — E669 Obesity, unspecified: Secondary | ICD-10-CM | POA: Diagnosis not present

## 2023-05-17 DIAGNOSIS — O0990 Supervision of high risk pregnancy, unspecified, unspecified trimester: Secondary | ICD-10-CM

## 2023-05-17 DIAGNOSIS — O0933 Supervision of pregnancy with insufficient antenatal care, third trimester: Secondary | ICD-10-CM

## 2023-05-17 DIAGNOSIS — O09293 Supervision of pregnancy with other poor reproductive or obstetric history, third trimester: Secondary | ICD-10-CM

## 2023-05-17 DIAGNOSIS — Z3A30 30 weeks gestation of pregnancy: Secondary | ICD-10-CM

## 2023-05-17 DIAGNOSIS — O09523 Supervision of elderly multigravida, third trimester: Secondary | ICD-10-CM

## 2023-05-17 NOTE — Telephone Encounter (Signed)
I spoke with the patient to discuss the recent genetic testing results performed on the amniotic fluid sample. Karyotype returned as normal female 27, XY. Chromosomal microarray returned as normal female arr(X,Y) x1, (1-22) x2. Based on these results, the lab did not find any extra or missing chromosomes or pieces of chromosomes. No chromosomal differences were detected in this fetus. Please see report for details.  Per our last discussion, I offered additional testing for Beckwith-Wiedemann syndrome. The patient elected to pursue BWS testing on the amniotic fluid sample. I have informed [facility].   Please reach out with any questions or concerns.   Sheppard Plumber, MS Genetic Counselor [facility] [phone]

## 2023-05-18 ENCOUNTER — Telehealth: Payer: Self-pay

## 2023-05-18 NOTE — Telephone Encounter (Signed)
I left a voicemail asking the patient to call back to discuss billing information for [facility].

## 2023-05-21 ENCOUNTER — Other Ambulatory Visit: Payer: MEDICAID

## 2023-05-21 ENCOUNTER — Telehealth: Payer: Self-pay

## 2023-05-21 ENCOUNTER — Ambulatory Visit: Payer: MEDICAID

## 2023-05-21 LAB — CMBP REQUESTED SENDOUT SPEC

## 2023-05-21 NOTE — Telephone Encounter (Signed)
I left a voicemail asking patient to call back to discuss add-on genetic testing.

## 2023-05-24 ENCOUNTER — Ambulatory Visit: Payer: MEDICAID | Admitting: Obstetrics & Gynecology

## 2023-05-24 ENCOUNTER — Telehealth: Payer: Self-pay

## 2023-05-24 ENCOUNTER — Other Ambulatory Visit: Payer: Self-pay

## 2023-05-24 VITALS — BP 104/64 | HR 104 | Wt 240.4 lb

## 2023-05-24 DIAGNOSIS — Z3A31 31 weeks gestation of pregnancy: Secondary | ICD-10-CM

## 2023-05-24 DIAGNOSIS — O0990 Supervision of high risk pregnancy, unspecified, unspecified trimester: Secondary | ICD-10-CM

## 2023-05-24 DIAGNOSIS — O35FXX0 Maternal care for other (suspected) fetal abnormality and damage, fetal musculoskeletal anomalies of trunk, not applicable or unspecified: Secondary | ICD-10-CM

## 2023-05-24 DIAGNOSIS — O0993 Supervision of high risk pregnancy, unspecified, third trimester: Secondary | ICD-10-CM

## 2023-05-24 NOTE — Telephone Encounter (Signed)
I spoke with Kristen Good and informed her that [facility] is ready to sent out the sample for Beckwith-Wiedemann syndrome (BWS) testing. I informed her that the lab was unable to provide me with estimated costs, and they recommended that the patient reach out to her insurance company and inquire about the CPT code costs. [place] declined additional BWS testing at this time and elected to have postnatal genetic testing and genetic counseling if needed. I informed her that I will let the lab know to cancel the testing by end of day.   Please reach out with any questions or concerns.  Sheppard Plumber, MS Genetic Counselor [facility] [phone]

## 2023-05-24 NOTE — Progress Notes (Signed)
   PRENATAL VISIT NOTE  Subjective:  Kristen Good is a 37 y.o. [id number] at [redacted]w[redacted]d being seen today for ongoing prenatal care.  She is currently monitored for the following issues for this high-risk pregnancy and has BMI 39.0-39.9,adult; Supervision of high risk pregnancy, antepartum; Late prenatal care affecting pregnancy; History of postpartum hemorrhage, currently pregnant; Advanced maternal age in multigravida; and Omphalocele of fetus on their problem list.  Patient reports no complaints.  Contractions: Not present. Vag. Bleeding: None.  Movement: Present. Denies leaking of fluid.   The following portions of the patient's history were reviewed and updated as appropriate: allergies, current medications, past family history, past medical history, past social history, past surgical history and problem list.   Objective:   Vitals:   05/24/23 1322  BP: 104/64  Pulse: (!) 104  Weight: 240 lb 6.4 oz (109 kg)    Fetal Status: Fetal Heart Rate (bpm): 145   Movement: Present     General:  Alert, oriented and cooperative. Patient is in no acute distress.  Skin: Skin is warm and dry. No rash noted.   Cardiovascular: Normal heart rate noted  Respiratory: Normal respiratory effort, no problems with respiration noted  Abdomen: Soft, gravid, appropriate for gestational age.  Pain/Pressure: Absent     Pelvic: Cervical exam deferred        Extremities: Normal range of motion.  Edema: None  Mental Status: Normal mood and affect. Normal behavior. Normal judgment and thought content.   Assessment and Plan:  Pregnancy: [id number] at [redacted]w[redacted]d 1. Omphalocele of fetus Will transfer to [facility] for delivery  2. Supervision of high risk pregnancy, antepartum   Preterm labor symptoms and general obstetric precautions including but not limited to vaginal bleeding, contractions, leaking of fluid and fetal movement were reviewed in detail with the patient. Please refer to After Visit Summary for other counseling  recommendations.   Return if symptoms worsen or fail to improve.  Future Appointments  Date Time Provider Department Center  05/28/2023  2:15 PM [facility] NURSE [facility]  05/28/2023  2:30 PM [facility] US3 [facility]  06/04/2023  2:30 PM [facility] US5 [facility]  06/11/2023  1:30 PM [facility] US1 [facility]    Scheryl Darter, MD

## 2023-05-25 LAB — MATERNAL CELL CONTAMINATION, B

## 2023-05-25 LAB — SPECIMEN STATUS REPORT

## 2023-05-28 ENCOUNTER — Encounter: Payer: Self-pay | Admitting: *Deleted

## 2023-05-28 ENCOUNTER — Ambulatory Visit: Payer: No Typology Code available for payment source | Attending: Obstetrics and Gynecology | Admitting: *Deleted

## 2023-05-28 ENCOUNTER — Ambulatory Visit (HOSPITAL_BASED_OUTPATIENT_CLINIC_OR_DEPARTMENT_OTHER): Payer: No Typology Code available for payment source

## 2023-05-28 VITALS — BP 112/65 | HR 101

## 2023-05-28 DIAGNOSIS — O0933 Supervision of pregnancy with insufficient antenatal care, third trimester: Secondary | ICD-10-CM

## 2023-05-28 DIAGNOSIS — O35FXX0 Maternal care for other (suspected) fetal abnormality and damage, fetal musculoskeletal anomalies of trunk, not applicable or unspecified: Secondary | ICD-10-CM | POA: Insufficient documentation

## 2023-05-28 DIAGNOSIS — O99213 Obesity complicating pregnancy, third trimester: Secondary | ICD-10-CM

## 2023-05-28 DIAGNOSIS — O09523 Supervision of elderly multigravida, third trimester: Secondary | ICD-10-CM

## 2023-05-28 DIAGNOSIS — O09293 Supervision of pregnancy with other poor reproductive or obstetric history, third trimester: Secondary | ICD-10-CM | POA: Diagnosis not present

## 2023-05-28 DIAGNOSIS — O0990 Supervision of high risk pregnancy, unspecified, unspecified trimester: Secondary | ICD-10-CM

## 2023-05-28 DIAGNOSIS — E669 Obesity, unspecified: Secondary | ICD-10-CM

## 2023-05-28 DIAGNOSIS — Z3A31 31 weeks gestation of pregnancy: Secondary | ICD-10-CM | POA: Insufficient documentation

## 2023-05-28 DIAGNOSIS — Z362 Encounter for other antenatal screening follow-up: Secondary | ICD-10-CM | POA: Diagnosis not present

## 2023-05-28 DIAGNOSIS — O358XX0 Maternal care for other (suspected) fetal abnormality and damage, not applicable or unspecified: Secondary | ICD-10-CM | POA: Insufficient documentation

## 2023-05-28 LAB — MATERNAL CELL CONTAMINATION

## 2023-06-04 ENCOUNTER — Ambulatory Visit: Payer: No Typology Code available for payment source | Admitting: *Deleted

## 2023-06-04 ENCOUNTER — Other Ambulatory Visit: Payer: Self-pay | Admitting: Obstetrics and Gynecology

## 2023-06-04 ENCOUNTER — Ambulatory Visit: Payer: No Typology Code available for payment source | Attending: Obstetrics and Gynecology

## 2023-06-04 DIAGNOSIS — O0933 Supervision of pregnancy with insufficient antenatal care, third trimester: Secondary | ICD-10-CM

## 2023-06-04 DIAGNOSIS — O35FXX0 Maternal care for other (suspected) fetal abnormality and damage, fetal musculoskeletal anomalies of trunk, not applicable or unspecified: Secondary | ICD-10-CM

## 2023-06-04 DIAGNOSIS — O09523 Supervision of elderly multigravida, third trimester: Secondary | ICD-10-CM | POA: Diagnosis not present

## 2023-06-04 DIAGNOSIS — O280 Abnormal hematological finding on antenatal screening of mother: Secondary | ICD-10-CM

## 2023-06-04 DIAGNOSIS — E669 Obesity, unspecified: Secondary | ICD-10-CM

## 2023-06-04 DIAGNOSIS — O09293 Supervision of pregnancy with other poor reproductive or obstetric history, third trimester: Secondary | ICD-10-CM

## 2023-06-04 DIAGNOSIS — O99213 Obesity complicating pregnancy, third trimester: Secondary | ICD-10-CM

## 2023-06-04 DIAGNOSIS — Z3A32 32 weeks gestation of pregnancy: Secondary | ICD-10-CM

## 2023-06-04 NOTE — Procedures (Signed)
Kristen Good 09-07-1986 [redacted]w[redacted]d  Fetus A Non-Stress Test Interpretation for 06/04/23  BPP with NST  Indication: Unsatisfactory BPP  Fetal Heart Rate A Mode: External Baseline Rate (A): 140 bpm Variability: Moderate Accelerations: 15 x 15 Decelerations: None Multiple birth?: No  Uterine Activity Mode: Palpation, Toco Contraction Frequency (min): none Resting Tone Palpated: Relaxed  Interpretation (Fetal Testing) Nonstress Test Interpretation: Reactive Overall Impression: Reassuring for gestational age Comments: Dr. Darra Lis reviewed tracing

## 2023-06-11 ENCOUNTER — Ambulatory Visit: Payer: No Typology Code available for payment source | Attending: Obstetrics and Gynecology

## 2023-06-11 DIAGNOSIS — O09523 Supervision of elderly multigravida, third trimester: Secondary | ICD-10-CM | POA: Diagnosis not present

## 2023-06-11 DIAGNOSIS — O09293 Supervision of pregnancy with other poor reproductive or obstetric history, third trimester: Secondary | ICD-10-CM | POA: Diagnosis not present

## 2023-06-11 DIAGNOSIS — O0933 Supervision of pregnancy with insufficient antenatal care, third trimester: Secondary | ICD-10-CM

## 2023-06-11 DIAGNOSIS — Z3A33 33 weeks gestation of pregnancy: Secondary | ICD-10-CM

## 2023-06-11 DIAGNOSIS — O35FXX0 Maternal care for other (suspected) fetal abnormality and damage, fetal musculoskeletal anomalies of trunk, not applicable or unspecified: Secondary | ICD-10-CM | POA: Insufficient documentation

## 2024-02-07 ENCOUNTER — Encounter (HOSPITAL_COMMUNITY): Payer: Self-pay

## 2024-02-07 ENCOUNTER — Ambulatory Visit (HOSPITAL_COMMUNITY): Admission: EM | Admit: 2024-02-07 | Discharge: 2024-02-07 | Disposition: A | Discharge status: Home / Self Care

## 2024-02-07 DIAGNOSIS — J028 Acute pharyngitis due to other specified organisms: Secondary | ICD-10-CM | POA: Diagnosis not present

## 2024-02-07 DIAGNOSIS — N390 Urinary tract infection, site not specified: Secondary | ICD-10-CM | POA: Diagnosis not present

## 2024-02-07 LAB — POCT URINALYSIS DIP (MANUAL ENTRY)
Blood, UA: NEGATIVE
Glucose, UA: NEGATIVE mg/dL
Nitrite, UA: NEGATIVE
Protein Ur, POC: 100 mg/dL — AB
Spec Grav, UA: 1.02 (ref 1.010–1.025)
Urobilinogen, UA: 4 U/dL — AB
pH, UA: 6.5 (ref 5.0–8.0)

## 2024-02-07 LAB — POCT RAPID STREP A (OFFICE): Rapid Strep A Screen: NEGATIVE

## 2024-02-07 MED ORDER — KETOROLAC TROMETHAMINE 30 MG/ML IJ SOLN
INTRAMUSCULAR | Status: AC
Start: 2024-02-07 — End: ?
  Filled 2024-02-07: qty 1

## 2024-02-07 MED ORDER — AMOXICILLIN 875 MG PO TABS: 875.0000 mg | ORAL_TABLET | Freq: Two times a day (BID) | ORAL | 0 refills | Status: AC

## 2024-02-07 MED ORDER — KETOROLAC TROMETHAMINE 30 MG/ML IJ SOLN
30.0000 mg | Freq: Once | INTRAMUSCULAR | Status: AC
  Administered 2024-02-07: 30 mg via INTRAMUSCULAR

## 2024-02-07 MED ORDER — METHYLPREDNISOLONE SODIUM SUCC 125 MG IJ SOLR
60.0000 mg | Freq: Once | INTRAMUSCULAR | Status: AC
  Administered 2024-02-07: 60 mg via INTRAMUSCULAR

## 2024-02-07 MED ORDER — METHYLPREDNISOLONE SODIUM SUCC 125 MG IJ SOLR
INTRAMUSCULAR | Status: AC
  Filled 2024-02-07: qty 2

## 2024-02-07 MED ORDER — PREDNISONE 20 MG PO TABS
40.0000 mg | ORAL_TABLET | Freq: Every day | ORAL | 0 refills | Status: AC
Start: 2024-02-07 — End: 2024-02-12

## 2024-02-07 NOTE — Discharge Instructions (Addendum)
 Urinalysis is consistent with a urinary tract infection.  Strep testing done today was negative however given physical exam findings and severity of symptoms this is likely some aspect of a bacterial pharyngitis.  We will treat with the following: Toradol injection given today. This is a medication to help with pain. This is not a narcotic.   Medrol injection given today. This is a steroid to help with inflammation and pain.  Amoxicillin 875 mg twice daily for 10 days.  This is an antibiotic.  Take this with food.  Start this tomorrow 5/30: Prednisone 40 mg (2 tablets) once daily for 5 days.  Stay hydrated.  Drink plenty of fluids even if you do not feel like taking in solid foods Return to urgent care or PCP if symptoms worsen or fail to resolve.

## 2024-02-07 NOTE — ED Provider Notes (Signed)
 [facility]    CSN: [id number] Arrival date & time: 02/07/24  1709      History   Chief Complaint Chief Complaint  Patient presents with   Dysuria   Sore Throat    HPI Kristen Good is a 38 y.o. female.   38 year old female who presents urgent care with complaints of dysuria, odorous urine and sore throat.  She reports that in regards to the urinary symptoms she started having symptoms about 2 weeks ago.  She took a few antibiotic pills that she had but it was not a full course.  She also drink a lot of cranberry juice.  Her symptoms seem to go away but then came back even worse 2 to 3 days ago.  She denies any fevers or chills associated with this.  She is having some mild abdominal pain.  In regards to her sore throat, her symptoms started Sunday night into Monday morning.  She is having a lot of trouble swallowing and has a lot of soreness around her neck.  She has not had any congestion, cough, ear pain, sick contacts.  She does relate that she has had some history of strep throat that is recurrent.    Dysuria Associated symptoms: no abdominal pain, no fever and no vomiting   Sore Throat Pertinent negatives include no chest pain, no abdominal pain and no shortness of breath.    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   Mental disorder    bipolar and depression    Patient Active Problem List   Diagnosis Date Noted   Omphalocele of fetus 04/06/2023   Supervision of high risk pregnancy, antepartum 03/26/2023   Late prenatal care affecting pregnancy 03/26/2023   History of postpartum hemorrhage, currently pregnant 03/26/2023   Advanced maternal age in multigravida 03/26/2023   BMI 39.0-39.9,adult 10/22/2019    Past Surgical History:  Procedure Laterality Date   NO PAST SURGERIES      OB History     Gravida  7   Para  2   Term  2   Preterm       AB  4   Living  2      SAB  3   IAB      Ectopic  1   Multiple  0   Live Births  2            Home Medications    Prior to Admission medications   Medication Sig Start Date End Date Taking? Authorizing Provider  amoxicillin (AMOXIL) 875 MG tablet Take 1 tablet (875 mg total) by mouth 2 (two) times daily for 10 days. 02/07/24 02/17/24 Yes Jasmin Trumbull A, PA-C  lamoTRIgine (LAMICTAL) 25 MG tablet Take 1 tablet by mouth daily. 02/04/24 05/04/24 Yes [provider]  predniSONE (DELTASONE) 20 MG tablet Take 2 tablets (40 mg total) by mouth daily with breakfast for 5 days. 02/07/24 02/12/24 Yes Kymberly Blomberg A, PA-C  QUEtiapine (SEROQUEL) 50 MG tablet Take 50 mg by mouth. 01/22/24 04/21/24 Yes [provider]  ferrous sulfate  325 (65 FE) MG EC tablet Take 1 tablet (325 mg total) by mouth every other day. 03/04/23   Almond Army, CNM    Family History Family History  Problem Relation Age of Onset   Asthma Daughter    Asthma Half-Brother     Social History Social History  Tobacco Use   Smoking status: Former    Current packs/day: 0.25    Types: Cigarettes   Smokeless tobacco: Never   Tobacco comments:    smokes 3 cig.week  Vaping Use   Vaping status: Never Used  Substance Use Topics   Alcohol use: Yes    Alcohol/week: 2.0 standard drinks of alcohol    Types: 2 Cans of beer per week    Comment: just occassions, pt denies current use   Drug use: No     Allergies   Patient has no known allergies.   Review of Systems Review of Systems  Constitutional:  Negative for chills and fever.  HENT:  Positive for sore throat and trouble swallowing. Negative for ear pain.   Eyes:  Negative for pain and visual disturbance.  Respiratory:  Negative for cough and shortness of breath.   Cardiovascular:  Negative for chest pain and palpitations.  Gastrointestinal:  Negative for abdominal pain and vomiting.  Genitourinary:  Positive for dysuria.  Negative for hematuria.  Musculoskeletal:  Negative for arthralgias and back pain.  Skin:  Negative for color change and rash.  Neurological:  Negative for seizures and syncope.  All other systems reviewed and are negative.    Physical Exam Triage Vital Signs ED Triage Vitals  Encounter Vitals Group     BP 02/07/24 1758 101/75     Systolic BP Percentile --      Diastolic BP Percentile --      Pulse Rate 02/07/24 1758 98     Resp 02/07/24 1758 18     Temp 02/07/24 1758 99.3 F (37.4 C)     Temp Source 02/07/24 1758 Oral     SpO2 02/07/24 1758 96 %     Weight --      Height --      Head Circumference --      Peak Flow --      Pain Score 02/07/24 1756 3     Pain Loc --      Pain Education --      Exclude from Growth Chart --    No data found.  Updated Vital Signs BP 101/75 (BP Location: Left Arm)   Pulse 98   Temp 99.3 F (37.4 C) (Oral)   Resp 18   LMP 02/02/2024 (Approximate)   SpO2 96%   Breastfeeding No   Visual Acuity Right Eye Distance:   Left Eye Distance:   Bilateral Distance:    Right Eye Near:   Left Eye Near:    Bilateral Near:     Physical Exam Vitals and nursing note reviewed.  Constitutional:      General: She is not in acute distress.    Appearance: She is well-developed.  HENT:     Head: Normocephalic and atraumatic.     Right Ear: Tympanic membrane normal.     Left Ear: Tympanic membrane normal.     Mouth/Throat:     Mouth: Mucous membranes are moist.     Pharynx: Pharyngeal swelling, oropharyngeal exudate and posterior oropharyngeal erythema present.     Tonsils: 2+ on the right. 2+ on the left.  Eyes:     Conjunctiva/sclera: Conjunctivae normal.  Cardiovascular:     Rate and Rhythm: Normal rate and regular rhythm.     Heart sounds: No murmur heard. Pulmonary:     Effort: Pulmonary effort is normal. No respiratory distress.     Breath sounds: Normal breath sounds.  Abdominal:     Palpations:  Abdomen is soft.     Tenderness:  There is no abdominal tenderness.  Musculoskeletal:        General: No swelling.     Cervical back: Neck supple.  Lymphadenopathy:     Head:     Right side of head: Submandibular adenopathy present.     Left side of head: Submandibular adenopathy present.     Cervical: Cervical adenopathy present.     Right cervical: Superficial cervical adenopathy present.     Left cervical: Superficial cervical adenopathy present.  Skin:    General: Skin is warm and dry.     Capillary Refill: Capillary refill takes less than 2 seconds.  Neurological:     Mental Status: She is alert.  Psychiatric:        Mood and Affect: Mood normal.      UC Treatments / Results  Labs (all labs ordered are listed, but only abnormal results are displayed) Labs Reviewed  POCT URINALYSIS DIP (MANUAL ENTRY) - Abnormal; Notable for the following components:      Result Value   Color, UA orange (*)    Clarity, UA cloudy (*)    Bilirubin, UA moderate (*)    Ketones, POC UA small (15) (*)    Protein Ur, POC =100 (*)    Urobilinogen, UA 4.0 (*)    Leukocytes, UA Trace (*)    All other components within normal limits  POCT RAPID STREP A (OFFICE)    EKG   Radiology No results found.  Procedures Procedures (including critical care time)  Medications Ordered in UC Medications  ketorolac  (TORADOL ) 30 MG/ML injection 30 mg (has no administration in time range)  methylPREDNISolone  sodium succinate (SOLU-MEDROL ) 125 mg/2 mL injection 60 mg (has no administration in time range)    Initial Impression / Assessment and Plan / UC Course  I have reviewed the triage vital signs and the nursing notes.  Pertinent labs & imaging results that were available during my care of the patient were reviewed by me and considered in my medical decision making (see chart for details).     Lower urinary tract infectious disease  Acute pharyngitis due to other specified organisms  Urinalysis is consistent with a urinary  tract infection along with the symptoms.  Strep testing done today was negative however given physical exam findings of erythremia and exudate along with some cervical lymphadenopathy and severity of symptoms this is likely some aspect of a bacterial pharyngitis.  We will treat with the following: Toradol  30 mg injection given today.  Medrol  60 mg injection given today.  Amoxicillin  875 mg twice daily for 10 days.  Start tomorrow: Prednisone  40 mg (2 tablets) once daily for 5 days.  Stay hydrated.  Drink plenty of fluids even if you do not feel like taking in solid foods Return to urgent care or PCP if symptoms worsen or fail to resolve.    Final Clinical Impressions(s) / UC Diagnoses   Final diagnoses:  Lower urinary tract infectious disease  Acute pharyngitis due to other specified organisms     Discharge Instructions      Urinalysis is consistent with a urinary tract infection.  Strep testing done today was negative however given physical exam findings and severity of symptoms this is likely some aspect of a bacterial pharyngitis.  We will treat with the following: Toradol  injection given today. This is a medication to help with pain. This is not a narcotic.   Medrol  injection given today. This is  a steroid to help with inflammation and pain.  Amoxicillin 875 mg twice daily for 10 days.  This is an antibiotic.  Take this with food.  Start this tomorrow 5/30: Prednisone 40 mg (2 tablets) once daily for 5 days.  Stay hydrated.  Drink plenty of fluids even if you do not feel like taking in solid foods Return to urgent care or PCP if symptoms worsen or fail to resolve.     ED Prescriptions     Medication Sig Dispense Auth. Provider   amoxicillin (AMOXIL) 875 MG tablet Take 1 tablet (875 mg total) by mouth 2 (two) times daily for 10 days. 20 tablet Elsa Ploch A, PA-C   predniSONE (DELTASONE) 20 MG tablet Take 2 tablets (40 mg total) by mouth daily with breakfast for 5 days. 10  tablet Kreg Pesa, [place]      PDMP not reviewed this encounter.   Kreg Pesa, [place] 02/07/24 1925

## 2024-02-07 NOTE — ED Triage Notes (Signed)
 Chief Complaint: burning urination, malodor with urine. Denies vaginal itching or discharge. Cough with the sore throat. States she has a history of reoccurring strep.   Sick exposure: No  Onset: sore throat 4 days, dysuria 2-3 days ago  Prescriptions or OTC medications tried: tylenol  with little relief.     New foods, medications, or products: No  Recent Travel: No

## 2024-10-20 ENCOUNTER — Ambulatory Visit: Payer: Self-pay | Admitting: Primary Care

## 2024-10-22 ENCOUNTER — Ambulatory Visit: Payer: MEDICAID | Admitting: *Deleted
